# Patient Record
Sex: Female | Born: 1990 | Race: White | Hispanic: No | Marital: Married | Smoking: Never smoker
Health system: Southern US, Community
[De-identification: ages and names within clinical notes are randomized; demographics above are authoritative.]

## PROBLEM LIST (undated history)

## (undated) DIAGNOSIS — Z87442 Personal history of urinary calculi: Secondary | ICD-10-CM

## (undated) DIAGNOSIS — N200 Calculus of kidney: Secondary | ICD-10-CM

## (undated) DIAGNOSIS — F32A Depression, unspecified: Secondary | ICD-10-CM

## (undated) DIAGNOSIS — N83209 Unspecified ovarian cyst, unspecified side: Secondary | ICD-10-CM

## (undated) HISTORY — PX: APPENDECTOMY: SHX54

## (undated) HISTORY — PX: WISDOM TOOTH EXTRACTION: SHX21

---
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
## 2015-07-25 ENCOUNTER — Emergency Department (HOSPITAL_COMMUNITY)
Admission: EM | Admit: 2015-07-25 | Discharge: 2015-07-25 | Disposition: A | Discharge status: Home / Self Care | Payer: BLUE CROSS/BLUE SHIELD | Attending: Emergency Medicine | Admitting: Emergency Medicine

## 2015-07-25 ENCOUNTER — Emergency Department (HOSPITAL_COMMUNITY): Payer: BLUE CROSS/BLUE SHIELD

## 2015-07-25 ENCOUNTER — Encounter (HOSPITAL_COMMUNITY): Payer: Self-pay | Admitting: Emergency Medicine

## 2015-07-25 DIAGNOSIS — Z9049 Acquired absence of other specified parts of digestive tract: Secondary | ICD-10-CM | POA: Insufficient documentation

## 2015-07-25 DIAGNOSIS — Z79899 Other long term (current) drug therapy: Secondary | ICD-10-CM | POA: Diagnosis not present

## 2015-07-25 DIAGNOSIS — N83201 Unspecified ovarian cyst, right side: Secondary | ICD-10-CM | POA: Diagnosis not present

## 2015-07-25 DIAGNOSIS — R1031 Right lower quadrant pain: Secondary | ICD-10-CM | POA: Diagnosis present

## 2015-07-25 DIAGNOSIS — Z3202 Encounter for pregnancy test, result negative: Secondary | ICD-10-CM | POA: Insufficient documentation

## 2015-07-25 DIAGNOSIS — N83209 Unspecified ovarian cyst, unspecified side: Secondary | ICD-10-CM

## 2015-07-25 DIAGNOSIS — R52 Pain, unspecified: Secondary | ICD-10-CM

## 2015-07-25 LAB — CBC
HEMATOCRIT: 39.4 % (ref 36.0–46.0)
HEMOGLOBIN: 13.2 g/dL (ref 12.0–15.0)
MCH: 30.6 pg (ref 26.0–34.0)
MCHC: 33.5 g/dL (ref 30.0–36.0)
MCV: 91.4 fL (ref 78.0–100.0)
PLATELETS: 220 10*3/uL (ref 150–400)
RBC: 4.31 MIL/uL (ref 3.87–5.11)
RDW: 12.6 % (ref 11.5–15.5)
WBC: 11.4 10*3/uL — AB (ref 4.0–10.5)

## 2015-07-25 LAB — COMPREHENSIVE METABOLIC PANEL
ALT: 22 U/L (ref 14–54)
ANION GAP: 9 (ref 5–15)
AST: 23 U/L (ref 15–41)
Albumin: 5.1 g/dL — ABNORMAL HIGH (ref 3.5–5.0)
Alkaline Phosphatase: 63 U/L (ref 38–126)
BUN: 14 mg/dL (ref 6–20)
CHLORIDE: 103 mmol/L (ref 101–111)
CO2: 25 mmol/L (ref 22–32)
Calcium: 9 mg/dL (ref 8.9–10.3)
Creatinine, Ser: 0.62 mg/dL (ref 0.44–1.00)
Glucose, Bld: 106 mg/dL — ABNORMAL HIGH (ref 65–99)
POTASSIUM: 3.4 mmol/L — AB (ref 3.5–5.1)
SODIUM: 137 mmol/L (ref 135–145)
Total Bilirubin: 0.8 mg/dL (ref 0.3–1.2)
Total Protein: 8.4 g/dL — ABNORMAL HIGH (ref 6.5–8.1)

## 2015-07-25 LAB — URINALYSIS, ROUTINE W REFLEX MICROSCOPIC
Bilirubin Urine: NEGATIVE
GLUCOSE, UA: NEGATIVE mg/dL
HGB URINE DIPSTICK: NEGATIVE
Ketones, ur: NEGATIVE mg/dL
LEUKOCYTES UA: NEGATIVE
Nitrite: NEGATIVE
PROTEIN: NEGATIVE mg/dL
SPECIFIC GRAVITY, URINE: 1.027 (ref 1.005–1.030)
pH: 6.5 (ref 5.0–8.0)

## 2015-07-25 LAB — LIPASE, BLOOD: LIPASE: 25 U/L (ref 11–51)

## 2015-07-25 LAB — I-STAT BETA HCG BLOOD, ED (MC, WL, AP ONLY): I-stat hCG, quantitative: <5 m[IU]/mL (ref <5)

## 2015-07-25 MED ORDER — IOHEXOL 300 MG/ML  SOLN
50.0000 mL | Freq: Once | INTRAMUSCULAR | Status: AC | PRN
  Administered 2015-07-25: 50 mL via ORAL

## 2015-07-25 MED ORDER — IBUPROFEN 200 MG PO TABS
400.0000 mg | ORAL_TABLET | Freq: Once | ORAL | Status: AC
  Administered 2015-07-25: 400 mg via ORAL
  Filled 2015-07-25: qty 2

## 2015-07-25 MED ORDER — IOHEXOL 300 MG/ML  SOLN
100.0000 mL | Freq: Once | INTRAMUSCULAR | Status: AC | PRN
  Administered 2015-07-25: 100 mL via INTRAVENOUS

## 2015-07-25 NOTE — ED Notes (Signed)
Patient ambulatory to restroom without assistance, steady gait. 

## 2015-07-25 NOTE — ED Notes (Signed)
Per pt, states abdominal pain with BM-states rectal pain as well

## 2015-07-25 NOTE — ED Provider Notes (Signed)
CSN: [id number]     Arrival date & time 07/25/15  1051 History   First MD Initiated Contact with Patient 07/25/15 1213     Chief Complaint  Patient presents with  . Abdominal Pain     (Consider location/radiation/quality/duration/timing/severity/associated sxs/prior Treatment) HPI   Lorraine Barker is a 25 y.o. female who presents for evaluation of lower abdominal pain, bilaterally, onset 3 days ago and persistent. The pain has waxed and waned some during this period of time. She denies fever, chills, nausea, vomiting, cough, shortness of breath, dysuria, urinary frequency, vaginal discharge or she had a normal bowel movement yesterday. There is been no change in color of stool. The pain seems to be worse, when she attempts to defecate. No prior similar problem. No trauma. There are no other known modifying factors.   History reviewed. No pertinent past medical history. Past Surgical History  Procedure Laterality Date  . Appendectomy     No family history on file. Social History  Substance Use Topics  . Smoking status: Never Smoker   . Smokeless tobacco: None  . Alcohol Use: No   OB History    No data available     Review of Systems  All other systems reviewed and are negative.     Allergies  Review of patient's allergies indicates no known allergies.  Home Medications   Prior to Admission medications   Medication Sig Start Date End Date Taking? Authorizing Provider  BIOTIN PO Take 1 tablet by mouth daily.   Yes Historical Provider, MD  PROTEIN PO Take 1 Can by mouth daily.   Yes Historical Provider, MD  pseudoephedrine (SUDAFED) 120 MG 12 hr tablet Take 120 mg by mouth every 12 (twelve) hours as needed for congestion.   Yes Historical Provider, MD  traMADol (ULTRAM) 50 MG tablet Take 50-100 mg by mouth every 6 (six) hours as needed for moderate pain.   Yes Historical Provider, MD   BP 127/75 mmHg  Pulse 95  Temp(Src) 98.1 F (36.7 C) (Oral)  Resp 14  Ht   (1.6 m)  Wt 135 lb (61.236 kg)  BMI 23.92 kg/m2  SpO2 100%  LMP 07/06/2015 Physical Exam  Constitutional: She is oriented to person, place, and time. She appears well-developed and well-nourished.  HENT:  Head: Normocephalic and atraumatic.  Right Ear: External ear normal.  Left Ear: External ear normal.  Eyes: Conjunctivae and EOM are normal. Pupils are equal, round, and reactive to light.  Neck: Normal range of motion and phonation normal. Neck supple.  Cardiovascular: Normal rate, regular rhythm and normal heart sounds.   Pulmonary/Chest: Effort normal and breath sounds normal. She exhibits no bony tenderness.  Abdominal: Soft. She exhibits no mass. There is tenderness (Bilateral LQ tenderness, mild.). There is no rebound and no guarding.  Musculoskeletal: Normal range of motion.  Neurological: She is alert and oriented to person, place, and time. No cranial nerve deficit or sensory deficit. She exhibits normal muscle tone. Coordination normal.  Skin: Skin is warm, dry and intact.  Psychiatric: She has a normal mood and affect. Her behavior is normal. Judgment and thought content normal.  Nursing note and vitals reviewed.   ED Course  Procedures (including critical care time)  Medications - No data to display  Patient Vitals for the past 24 hrs:  BP Temp Temp src Pulse Resp SpO2 Height Weight  07/25/15 1112 127/75 mmHg 98.1 F (36.7 C) Oral 95 14 100 %  (1.6 m) 135  lb (61.236 kg)    5:24 PM Reevaluation with update and discussion. After initial assessment and treatment, an updated evaluation reveals she remains comfortable and has no additional complaints. Findings discussed with patient, and family members, all questions answered. Ugonna Keirsey L    Labs Review Labs Reviewed  COMPREHENSIVE METABOLIC PANEL - Abnormal; Notable for the following:    Potassium 3.4 (*)    Glucose, Bld 106 (*)    Total Protein 8.4 (*)    Albumin 5.1 (*)    All other components within  normal limits  CBC - Abnormal; Notable for the following:    WBC 11.4 (*)    All other components within normal limits  LIPASE, BLOOD  URINALYSIS, ROUTINE W REFLEX MICROSCOPIC (NOT AT [facility])    Imaging Review No results found. I have personally reviewed and evaluated these images and lab results as part of my medical decision-making.   EKG Interpretation None      MDM   Final diagnoses:  Hemorrhagic ovarian cyst    Ovarian cyst with mild bleeding, but no apparent infectious process, or hemodynamic instability.  Nursing Notes Reviewed/ Care Coordinated Applicable Imaging Reviewed Interpretation of Laboratory Data incorporated into ED treatment  The patient appears reasonably screened and/or stabilized for discharge and I doubt any other medical condition or other [facility] requiring further screening, evaluation, or treatment in the ED at this time prior to discharge.  Plan: Home Medications- OTC analgesia; Home Treatments- rest; return here if the recommended treatment, does not improve the symptoms; Recommended follow up- GYN 3-4 weeks and prn     Mancel Bale, MD 07/25/15 1726

## 2015-07-25 NOTE — Discharge Instructions (Signed)
Ovarian Cyst An ovarian cyst is a fluid-filled sac that forms on an ovary. The ovaries are small organs that produce eggs in women. Various types of cysts can form on the ovaries. Most are not cancerous. Many do not cause problems, and they often go away on their own. Some may cause symptoms and require treatment. Common types of ovarian cysts include:  Functional cysts--These cysts may occur every month during the menstrual cycle. This is normal. The cysts usually go away with the next menstrual cycle if the woman does not get pregnant. Usually, there are no symptoms with a functional cyst.  Endometrioma cysts--These cysts form from the tissue that lines the uterus. They are also called "chocolate cysts" because they become filled with blood that turns brown. This type of cyst can cause pain in the lower abdomen during intercourse and with your menstrual period.  Cystadenoma cysts--This type develops from the cells on the outside of the ovary. These cysts can get very big and cause lower abdomen pain and pain with intercourse. This type of cyst can twist on itself, cut off its blood supply, and cause severe pain. It can also easily rupture and cause a lot of pain.  Dermoid cysts--This type of cyst is sometimes found in both ovaries. These cysts may contain different kinds of body tissue, such as skin, teeth, hair, or cartilage. They usually do not cause symptoms unless they get very big.  Theca lutein cysts--These cysts occur when too much of a certain hormone (human chorionic gonadotropin) is produced and overstimulates the ovaries to produce an egg. This is most common after procedures used to assist with the conception of a baby (in vitro fertilization). CAUSES   Fertility drugs can cause a condition in which multiple large cysts are formed on the ovaries. This is called ovarian hyperstimulation syndrome.  A condition called polycystic ovary syndrome can cause hormonal imbalances that can lead to  nonfunctional ovarian cysts. SIGNS AND SYMPTOMS  Many ovarian cysts do not cause symptoms. If symptoms are present, they may include:  Pelvic pain or pressure.  Pain in the lower abdomen.  Pain during sexual intercourse.  Increasing girth (swelling) of the abdomen.  Abnormal menstrual periods.  Increasing pain with menstrual periods.  Stopping having menstrual periods without being pregnant. DIAGNOSIS  These cysts are commonly found during a routine or annual pelvic exam. Tests may be ordered to find out more about the cyst. These tests may include:  Ultrasound.  X-ray of the pelvis.  CT scan.  MRI.  Blood tests. TREATMENT  Many ovarian cysts go away on their own without treatment. Your health care provider may want to check your cyst regularly for 2-3 months to see if it changes. For women in menopause, it is particularly important to monitor a cyst closely because of the higher rate of ovarian cancer in menopausal women. When treatment is needed, it may include any of the following:  A procedure to drain the cyst (aspiration). This may be done using a long needle and ultrasound. It can also be done through a laparoscopic procedure. This involves using a thin, lighted tube with a tiny camera on the end (laparoscope) inserted through a small incision.  Surgery to remove the whole cyst. This may be done using laparoscopic surgery or an open surgery involving a larger incision in the lower abdomen.  Hormone treatment or birth control pills. These methods are sometimes used to help dissolve a cyst. HOME CARE INSTRUCTIONS   Only take over-the-counter   or prescription medicines as directed by your health care provider.  Follow up with your health care provider as directed.  Get regular pelvic exams and Pap tests. SEEK MEDICAL CARE IF:   Your periods are late, irregular, or painful, or they stop.  Your pelvic pain or abdominal pain does not go away.  Your abdomen becomes  larger or swollen.  You have pressure on your bladder or trouble emptying your bladder completely.  You have pain during sexual intercourse.  You have feelings of fullness, pressure, or discomfort in your stomach.  You lose weight for no apparent reason.  You feel generally ill.  You become constipated.  You lose your appetite.  You develop acne.  You have an increase in body and facial hair.  You are gaining weight, without changing your exercise and eating habits.  You think you are pregnant. SEEK IMMEDIATE MEDICAL CARE IF:   You have increasing abdominal pain.  You feel sick to your stomach (nauseous), and you throw up (vomit).  You develop a fever that comes on suddenly.  You have abdominal pain during a bowel movement.  Your menstrual periods become heavier than usual. MAKE SURE YOU:  Understand these instructions.  Will watch your condition.  Will get help right away if you are not doing well or get worse.   This information is not intended to replace advice given to you by your health care provider. Make sure you discuss any questions you have with your health care provider.   Document Released: 06/15/2005 Document Revised: 06/20/2013 Document Reviewed: 02/20/2013 Elsevier Interactive Patient Education 2016 [employer]  

## 2017-04-19 ENCOUNTER — Emergency Department (HOSPITAL_COMMUNITY): Payer: Commercial Managed Care - PPO

## 2017-04-19 ENCOUNTER — Emergency Department (HOSPITAL_COMMUNITY)
Admission: EM | Admit: 2017-04-19 | Discharge: 2017-04-19 | Disposition: A | Discharge status: Home / Self Care | Payer: Commercial Managed Care - PPO | Attending: Emergency Medicine | Admitting: Emergency Medicine

## 2017-04-19 ENCOUNTER — Encounter (HOSPITAL_COMMUNITY): Payer: Self-pay

## 2017-04-19 DIAGNOSIS — N130 Hydronephrosis with ureteropelvic junction obstruction: Secondary | ICD-10-CM | POA: Diagnosis not present

## 2017-04-19 DIAGNOSIS — R1110 Vomiting, unspecified: Secondary | ICD-10-CM | POA: Diagnosis not present

## 2017-04-19 DIAGNOSIS — N201 Calculus of ureter: Secondary | ICD-10-CM

## 2017-04-19 DIAGNOSIS — R1032 Left lower quadrant pain: Secondary | ICD-10-CM | POA: Diagnosis present

## 2017-04-19 DIAGNOSIS — Z79899 Other long term (current) drug therapy: Secondary | ICD-10-CM | POA: Insufficient documentation

## 2017-04-19 DIAGNOSIS — N23 Unspecified renal colic: Secondary | ICD-10-CM

## 2017-04-19 DIAGNOSIS — R109 Unspecified abdominal pain: Secondary | ICD-10-CM

## 2017-04-19 HISTORY — DX: Calculus of kidney: N20.0

## 2017-04-19 HISTORY — DX: Unspecified ovarian cyst, unspecified side: N83.209

## 2017-04-19 LAB — URINALYSIS, ROUTINE W REFLEX MICROSCOPIC
BILIRUBIN URINE: NEGATIVE
Glucose, UA: NEGATIVE mg/dL
KETONES UR: NEGATIVE mg/dL
Leukocytes, UA: NEGATIVE
NITRITE: NEGATIVE
PROTEIN: NEGATIVE mg/dL
Specific Gravity, Urine: 1.009 (ref 1.005–1.030)
pH: 5 (ref 5.0–8.0)

## 2017-04-19 LAB — PREGNANCY, URINE: PREG TEST UR: NEGATIVE

## 2017-04-19 MED ORDER — KETOROLAC TROMETHAMINE 60 MG/2ML IM SOLN
60.0000 mg | Freq: Once | INTRAMUSCULAR | Status: AC
  Administered 2017-04-19: 60 mg via INTRAMUSCULAR
  Filled 2017-04-19: qty 2

## 2017-04-19 MED ORDER — HYDROCODONE-ACETAMINOPHEN 5-325 MG PO TABS: ORAL_TABLET | ORAL | 0 refills | Status: DC

## 2017-04-19 MED ORDER — MORPHINE SULFATE (PF) 4 MG/ML IV SOLN
4.0000 mg | Freq: Once | INTRAVENOUS | Status: AC
  Administered 2017-04-19: 4 mg via INTRAMUSCULAR
  Filled 2017-04-19: qty 1

## 2017-04-19 MED ORDER — ONDANSETRON 8 MG PO TBDP
8.0000 mg | ORAL_TABLET | Freq: Once | ORAL | Status: AC
  Administered 2017-04-19: 8 mg via ORAL
  Filled 2017-04-19: qty 1

## 2017-04-19 MED ORDER — NAPROXEN 250 MG PO TABS: 250.0000 mg | ORAL_TABLET | Freq: Two times a day (BID) | ORAL | 0 refills | Status: DC

## 2017-04-19 MED ORDER — ONDANSETRON 4 MG PO TBDP: 4.0000 mg | ORAL_TABLET | Freq: Three times a day (TID) | ORAL | 0 refills | Status: DC | PRN

## 2017-04-19 MED ORDER — NAPROXEN 250 MG PO TABS: 250.0000 mg | ORAL_TABLET | Freq: Two times a day (BID) | ORAL | 0 refills | Status: DC | PRN

## 2017-04-19 NOTE — ED Provider Notes (Signed)
[facility]-EMERGENCY DEPT Provider Note   CSN: [id number] Arrival date & time: 04/19/17  [id number]     History   Chief Complaint Chief Complaint  Patient presents with  . Flank Pain  . Emesis    HPI Lorraine Barker is a 26 y.o. female.  HPI  Pt was seen at 0925. Per pt, c/o sudden onset and persistence of waxing and waning left sided flank "pain" that began this morning at 0500 PTA.  Pt describes the pain as "like my last kidney stone," and radiating into the left side of her abd.  Has been associated with multiple intermittent episodes of N/V.  Denies vaginal bleeding/discharge, no dysuria/hematuria, no abd pain, no diarrhea, no black or blood in emesis, no CP/SOB, no fevers, no rash.      Past Medical History:  Diagnosis Date  . Kidney stone   . Ovarian cyst     There are no active problems to display for this patient.   Past Surgical History:  Procedure Laterality Date  . APPENDECTOMY      OB History    No data available       Home Medications    Prior to Admission medications   Medication Sig Start Date End Date Taking? Authorizing Provider  BIOTIN PO Take 1 tablet by mouth daily.    [provider]  PROTEIN PO Take 1 Can by mouth daily.    [provider]  pseudoephedrine (SUDAFED) 120 MG 12 hr tablet Take 120 mg by mouth every 12 (twelve) hours as needed for congestion.    [provider]  traMADol (ULTRAM) 50 MG tablet Take 50-100 mg by mouth every 6 (six) hours as needed for moderate pain.    [provider]    Family History Family History  Problem Relation Age of Onset  . Kidney Stones Father     Social History Social History  Substance Use Topics  . Smoking status: Never Smoker  . Smokeless tobacco: Never Used  . Alcohol use No     Allergies   Patient has no known allergies.   Review of Systems Review of Systems ROS: Statement: All systems negative except as marked or noted in the  HPI; Constitutional: Negative for fever and chills. ; ; Eyes: Negative for eye pain, redness and discharge. ; ; ENMT: Negative for ear pain, hoarseness, nasal congestion, sinus pressure and sore throat. ; ; Cardiovascular: Negative for chest pain, palpitations, diaphoresis, dyspnea and peripheral edema. ; ; Respiratory: Negative for cough, wheezing and stridor. ; ; Gastrointestinal: +N/V. Negative for diarrhea, abdominal pain, blood in stool, hematemesis, jaundice and rectal bleeding. . ; ; Genitourinary: Negative for dysuria, and hematuria. ; ; GYN:  No pelvic pain, no vaginal bleeding, no vaginal discharge, no vulvar pain. ;; Musculoskeletal: +LBP. Negative for neck pain. Negative for swelling and trauma.; ; Skin: Negative for pruritus, rash, abrasions, blisters, bruising and skin lesion.; ; Neuro: Negative for headache, lightheadedness and neck stiffness. Negative for weakness, altered level of consciousness, altered mental status, extremity weakness, paresthesias, involuntary movement, seizure and syncope.       Physical Exam Updated Vital Signs BP 122/85 (BP Location: Left Arm)   Pulse 69   Temp 98.2 F (36.8 C) (Oral)   Resp 16   Ht 5' 3.5" (1.613 m)   Wt 81.6 kg (180 lb)   LMP 03/28/2017 (Approximate)   SpO2 100%   BMI 31.39 kg/m   Physical Exam 0930: Physical examination:  Nursing notes  reviewed; Vital signs and O2 SAT reviewed;  Constitutional: Well developed, Well nourished, Well hydrated, Uncomfortable appearing.; Head:  Normocephalic, atraumatic; Eyes: EOMI, PERRL, No scleral icterus; ENMT: Mouth and pharynx normal, Mucous membranes moist; Neck: Supple, Full range of motion, No lymphadenopathy; Cardiovascular: Regular rate and rhythm, No gallop; Respiratory: Breath sounds clear & equal bilaterally, No wheezes.  Speaking full sentences with ease, Normal respiratory effort/excursion; Chest: Nontender, Movement normal; Abdomen: Soft, Nontender, Nondistended, Normal bowel sounds;  Genitourinary: No CVA tenderness; Spine:  No midline CS, TS, LS tenderness.;; Extremities: Pulses normal, No tenderness, No edema, No calf edema or asymmetry.; Neuro: AA&Ox3, Major CN grossly intact.  Speech clear. No gross focal motor or sensory deficits in extremities.; Skin: Color normal, Warm, Dry.   ED Treatments / Results  Labs (all labs ordered are listed, but only abnormal results are displayed)   EKG  EKG Interpretation None       Radiology   Procedures Procedures (including critical care time)  Medications Ordered in ED Medications  ondansetron (ZOFRAN-ODT) disintegrating tablet 8 mg (not administered)  morphine 4 MG/ML injection 4 mg (4 mg Intramuscular Given 04/19/17 0934)     Initial Impression / Assessment and Plan / ED Course  I have reviewed the triage vital signs and the nursing notes.  Pertinent labs & imaging results that were available during my care of the patient were reviewed by me and considered in my medical decision making (see chart for details).  MDM Reviewed: previous chart, nursing note and vitals Reviewed previous: CT scan and ultrasound Interpretation: labs and CT scan    Results for orders placed or performed during the hospital encounter of 04/19/17  Urinalysis, Routine w reflex microscopic- may I&O cath if menses  Result Value Ref Range   Color, Urine YELLOW YELLOW   APPearance CLEAR CLEAR   Specific Gravity, Urine 1.009 1.005 - 1.030   pH 5.0 5.0 - 8.0   Glucose, UA NEGATIVE NEGATIVE mg/dL   Hgb urine dipstick LARGE (A) NEGATIVE   Bilirubin Urine NEGATIVE NEGATIVE   Ketones, ur NEGATIVE NEGATIVE mg/dL   Protein, ur NEGATIVE NEGATIVE mg/dL   Nitrite NEGATIVE NEGATIVE   Leukocytes, UA NEGATIVE NEGATIVE   RBC / HPF 6-30 0 - 5 RBC/hpf   WBC, UA 0-5 0 - 5 WBC/hpf   Bacteria, UA RARE (A) NONE SEEN   Squamous Epithelial / LPF 0-5 (A) NONE SEEN   Mucus PRESENT   Pregnancy, urine  Result Value Ref Range   Preg Test, Ur NEGATIVE  NEGATIVE   Ct Renal Stone Study Result Date: 04/19/2017 CLINICAL DATA:  Left flank pain radiating to the left lower quadrant beginning 6 hours ago. History of stone disease. EXAM: CT ABDOMEN AND PELVIS WITHOUT CONTRAST TECHNIQUE: Multidetector CT imaging of the abdomen and pelvis was performed following the standard protocol without IV contrast. COMPARISON:  07/25/2015 FINDINGS: Lower chest: Normal Hepatobiliary: Normal Pancreas: Normal Spleen: Normal Adrenals/Urinary Tract: Adrenal glands are normal. Right kidney is normal. Left kidney shows mild swelling an mild prominence of the collecting system an ureter. There is a 2 mm stone at the left UVJ. No other stone. Stomach/Bowel: Normal Vascular/Lymphatic: Normal Reproductive: Normal Other: No free fluid or air. Musculoskeletal: Normal IMPRESSION: 2 mm stone at the left UVJ, about pass into the bladder. Mild/ minimal hydronephrosis on the left. No other urinary tract finding. Electronically Signed   By: Paulina Fusi M.D.   On: 04/19/2017 12:43    1335:  Pt has tol PO well while  in the ED without N/V.  Abd remains benign, VSS. Feels better and wants to go home now. Tx symptomatically at this time. Dx and testing d/w pt and family.  Questions answered.  Verb understanding, agreeable to d/c home with outpt f/u.     Final Clinical Impressions(s) / ED Diagnoses   Final diagnoses:  None    New Prescriptions New Prescriptions   No medications on file     Samuel Jester, DO 04/23/17 1610

## 2017-04-19 NOTE — ED Triage Notes (Signed)
Patient c/o left flank pain at 0500 today. Patient also c/o N/V and and dysuria. Patient states she took Phenergan and a Hydrocone that her per father had for pain at 0730.

## 2017-04-19 NOTE — Discharge Instructions (Signed)
Take the prescriptions as directed.  Call your regular medical doctor and the Urologist today to schedule a follow up appointment this week.  Return to the Emergency Department immediately if worsening.

## 2018-04-12 ENCOUNTER — Encounter: Payer: Self-pay | Admitting: Physician Assistant

## 2018-04-12 ENCOUNTER — Ambulatory Visit (INDEPENDENT_AMBULATORY_CARE_PROVIDER_SITE_OTHER): Payer: BLUE CROSS/BLUE SHIELD | Admitting: Physician Assistant

## 2018-04-12 VITALS — BP 118/74 | HR 82 | Temp 98.7°F | Ht 63.5 in | Wt 172.4 lb

## 2018-04-12 DIAGNOSIS — K580 Irritable bowel syndrome with diarrhea: Secondary | ICD-10-CM | POA: Insufficient documentation

## 2018-04-12 DIAGNOSIS — Z01419 Encounter for gynecological examination (general) (routine) without abnormal findings: Secondary | ICD-10-CM | POA: Diagnosis not present

## 2018-04-12 MED ORDER — HYOSCYAMINE SULFATE 0.125 MG PO TABS: 0.1250 mg | ORAL_TABLET | Freq: Four times a day (QID) | ORAL | 5 refills | Status: DC | PRN

## 2018-04-12 NOTE — Progress Notes (Signed)
BP 118/74   Pulse 82   Temp 98.7 F (37.1 C) (Oral)   Ht 5' 3.5" (1.613 m)   Wt 172 lb 6.4 oz (78.2 kg)   LMP 03/28/2018   BMI 30.06 kg/m    Subjective:    Patient ID: Lorraine Barker, female    DOB: [birth year], 27 y.o.   MRN: [id number]  HPI: Shirleymae Hauth is a 27 y.o. female presenting on 04/12/2018 for Annual Exam  This patient comes in for annual well physical examination. All medications are reviewed today. There are no reports of any problems with the medications. All of the medical conditions are reviewed and updated.  Lab work is reviewed and will be ordered as medically necessary.   The only problem that the patient reports is that she is having episodes of irritable bowel.  She will notice it some.  Having more in restaurants and will happen when she travels.  She is uncertain if it is related somewhat to be nervous.  We have had a long discussion about the possibility of this in that he can be aggravated by emotions.  She has never taken anything except Pepto-Bismol orally.  She states that it did not help any.  Past Medical History:  Diagnosis Date  . Kidney stone   . Ovarian cyst    Relevant past medical, surgical, family and social history reviewed and updated as indicated. Interim medical history since our last visit reviewed. Allergies and medications reviewed and updated. DATA REVIEWED: CHART IN EPIC  Family History reviewed for pertinent findings.  Review of Systems  Constitutional: Negative.  Negative for activity change, fatigue and fever.  HENT: Negative.   Eyes: Negative.   Respiratory: Negative.  Negative for cough.   Cardiovascular: Negative.  Negative for chest pain.  Gastrointestinal: Positive for abdominal distention and diarrhea. Negative for abdominal pain.  Endocrine: Negative.   Genitourinary: Negative.  Negative for dysuria.  Musculoskeletal: Negative.   Skin: Negative.   Neurological: Negative.     Allergies as of 04/12/2018   No Known  Allergies     Medication List        Accurate as of 04/12/18  1:40 PM. Always use your most recent med list.          hyoscyamine 0.125 MG tablet Commonly known as:  LEVSIN, ANASPAZ Take 1 tablet (0.125 mg total) by mouth every 6 (six) hours as needed for cramping (IBS).   loratadine 10 MG tablet Commonly known as:  CLARITIN Take 10 mg by mouth daily as needed for allergies.          Objective:    BP 118/74   Pulse 82   Temp 98.7 F (37.1 C) (Oral)   Ht 5' 3.5" (1.613 m)   Wt 172 lb 6.4 oz (78.2 kg)   LMP 03/28/2018   BMI 30.06 kg/m   No Known Allergies  Wt Readings from Last 3 Encounters:  04/12/18 172 lb 6.4 oz (78.2 kg)  04/19/17 180 lb (81.6 kg)  07/25/15 135 lb (61.2 kg)    Physical Exam  Constitutional: She is oriented to person, place, and time. She appears well-developed and well-nourished.  HENT:  Head: Normocephalic and atraumatic.  Eyes: Pupils are equal, round, and reactive to light. Conjunctivae and EOM are normal.  Neck: Normal range of motion. Neck supple.  Cardiovascular: Normal rate, regular rhythm, normal heart sounds and intact distal pulses.  Pulmonary/Chest: Effort normal and breath sounds normal. Right breast exhibits no mass, no skin  change and no tenderness. Left breast exhibits no mass, no skin change and no tenderness. No breast tenderness, discharge or bleeding. Breasts are symmetrical.  Abdominal: Soft. Bowel sounds are normal.  Genitourinary: Vagina normal and uterus normal. Rectal exam shows no fissure. No breast tenderness, discharge or bleeding. There is no tenderness or lesion on the right labia. There is no tenderness or lesion on the left labia. Uterus is not deviated, not enlarged and not tender. Cervix exhibits no motion tenderness, no discharge and no friability. Right adnexum displays no mass, no tenderness and no fullness. Left adnexum displays no mass, no tenderness and no fullness. No tenderness or bleeding in the vagina. No  vaginal discharge found.  Neurological: She is alert and oriented to person, place, and time. She has normal reflexes.  Skin: Skin is warm and dry. No rash noted.  Psychiatric: She has a normal mood and affect. Her behavior is normal. Judgment and thought content normal.        Assessment & Plan:   1. Well female exam with routine gynecological exam - Pap IG, CT/NG w/ reflex HPV when ASC-U  2. Irritable bowel syndrome with diarrhea - hyoscyamine (LEVSIN, ANASPAZ) 0.125 MG tablet; Take 1 tablet (0.125 mg total) by mouth every 6 (six) hours as needed for cramping (IBS).  Dispense: 30 tablet; Refill: 5 - Ambulatory referral to Gastroenterology   Continue all other maintenance medications as listed above.  Follow up plan: No follow-ups on file.  Educational handout given for IBS information  Remus Loffler PA-C [facility] [address]  [place], [place] [address] [phone]   04/12/2018, 1:40 PM

## 2018-04-12 NOTE — Patient Instructions (Signed)
Irritable Bowel Syndrome, Adult Irritable bowel syndrome (IBS) is not one specific disease. It is a group of symptoms that affects the organs responsible for digestion (gastrointestinal or GI tract). To regulate how your GI tract works, your body sends signals back and forth between your intestines and your brain. If you have IBS, there may be a problem with these signals. As a result, your GI tract does not function normally. Your intestines may become more sensitive and overreact to certain things. This is especially true when you eat certain foods or when you are under stress. There are four types of IBS. These may be determined based on the consistency of your stool:  IBS with diarrhea.  IBS with constipation.  Mixed IBS.  Unsubtyped IBS.  It is important to know which type of IBS you have. Some treatments are more likely to be helpful for certain types of IBS. What are the causes? The exact cause of IBS is not known. What increases the risk? You may have a higher risk of IBS if:  You are a woman.  You are younger than 29 years old.  You have a family history of IBS.  You have mental health problems.  You have had bacterial infection of your GI tract.  What are the signs or symptoms? Symptoms of IBS vary from person to person. The main symptom is abdominal pain or discomfort. Additional symptoms usually include one or more of the following:  Diarrhea, constipation, or both.  Abdominal swelling or bloating.  Feeling full or sick after eating a small or regular-size meal.  Frequent gas.  Mucus in the stool.  A feeling of having more stool left after a bowel movement.  Symptoms tend to come and go. They may be associated with stress, psychiatric conditions, or nothing at all. How is this diagnosed? There is no specific test to diagnose IBS. Your health care provider will make a diagnosis based on a physical exam, medical history, and your symptoms. You may have other  tests to rule out other conditions that may be causing your symptoms. These may include:  Blood tests.  X-rays.  CT scan.  Endoscopy and colonoscopy. This is a test in which your GI tract is viewed with a long, thin, flexible tube.  How is this treated? There is no cure for IBS, but treatment can help relieve symptoms. IBS treatment often includes:  Changes to your diet, such as: ? Eating more fiber. ? Avoiding foods that cause symptoms. ? Drinking more water. ? Eating regular, medium-sized portioned meals.  Medicines. These may include: ? Fiber supplements if you have constipation. ? Medicine to control diarrhea (antidiarrheal medicines). ? Medicine to help control muscle spasms in your GI tract (antispasmodic medicines). ? Medicines to help with any mental health issues, such as antidepressants or tranquilizers.  Therapy. ? Talk therapy may help with anxiety, depression, or other mental health issues that can make IBS symptoms worse.  Stress reduction. ? Managing your stress can help keep symptoms under control.  Follow these instructions at home:  Take medicines only as directed by your health care provider.  Eat a healthy diet. ? Avoid foods and drinks with added sugar. ? Include more whole grains, fruits, and vegetables gradually into your diet. This may be especially helpful if you have IBS with constipation. ? Avoid any foods and drinks that make your symptoms worse. These may include dairy products and caffeinated or carbonated drinks. ? Do not eat large meals. ? Drink enough   fluid to keep your urine clear or pale yellow.  Exercise regularly. Ask your health care provider for recommendations of good activities for you.  Keep all follow-up visits as directed by your health care provider. This is important. Contact a health care provider if:  You have constant pain.  You have trouble or pain with swallowing.  You have worsening diarrhea. Get help right away  if:  You have severe and worsening abdominal pain.  You have diarrhea and: ? You have a rash, stiff neck, or severe headache. ? You are irritable, sleepy, or difficult to awaken. ? You are weak, dizzy, or extremely thirsty.  You have bright red blood in your stool or you have black tarry stools.  You have unusual abdominal swelling that is painful.  You vomit continuously.  You vomit blood (hematemesis).  You have both abdominal pain and a fever. This information is not intended to replace advice given to you by your health care provider. Make sure you discuss any questions you have with your health care provider. Document Released: 06/15/2005 Document Revised: 11/15/2015 Document Reviewed: 03/02/2014 Elsevier Interactive Patient Education  2018 [employer]  

## 2018-04-16 LAB — PAP IG, CT-NG, RFX HPV ASCU
Chlamydia, Nuc. Acid Amp: NEGATIVE
Gonococcus by Nucleic Acid Amp: NEGATIVE
PAP SMEAR COMMENT: 0

## 2018-05-16 ENCOUNTER — Ambulatory Visit: Payer: BLUE CROSS/BLUE SHIELD

## 2018-05-17 ENCOUNTER — Telehealth: Payer: Commercial Managed Care - PPO | Admitting: Physician Assistant

## 2018-05-17 DIAGNOSIS — J069 Acute upper respiratory infection, unspecified: Secondary | ICD-10-CM

## 2018-05-17 MED ORDER — IBUPROFEN 600 MG PO TABS: 600.0000 mg | ORAL_TABLET | Freq: Three times a day (TID) | ORAL | 0 refills | Status: DC | PRN

## 2018-05-17 MED ORDER — BENZONATATE 100 MG PO CAPS: 100.0000 mg | ORAL_CAPSULE | Freq: Three times a day (TID) | ORAL | 0 refills | Status: DC

## 2018-05-17 MED ORDER — IPRATROPIUM BROMIDE 0.06 % NA SOLN: 2.0000 | Freq: Four times a day (QID) | NASAL | 12 refills | Status: DC

## 2018-05-17 NOTE — Progress Notes (Signed)
We are sorry you are not feeling well.  Here is how we plan to help!  Based on what you have shared with me, it looks like you may have a viral upper respiratory infection or a "common cold".  Colds are caused by a large number of viruses; however, rhinovirus is the most common cause.   Symptoms of the common cold vary from person to person, with common symptoms including sore throat, cough, and malaise.  A low-grade fever of 100.4 may present, but is often uncommon.  Symptoms vary however, and are closely related to a person's age or underlying illnesses.  The most common symptoms associated with the common cold are nasal discharge or congestion, cough, sneezing, headache and pressure in the ears and face.  Cold symptoms usually persist for about 3 to 10 days, but can last up to 2 weeks.  It is important to know that colds do not cause serious illness or complications in most cases.    The common cold is transmitted from person to person, with the most common method of transmission being a person's hands.  The virus is able to live on the skin and can infect other persons for up to 2 hours after direct contact.  Also, colds are transmitted when someone coughs or sneezes; thus, it is important to cover the mouth to reduce this risk.  To keep the spread of the common cold at bay, good hand hygiene is very important.  This is an infection that is most likely caused by a virus. There are no specific treatments for the common cold other than to help you with the symptoms until the infection runs its course.    For nasal congestion, you may use an oral decongestants such as Mucinex D or if you have glaucoma or high blood pressure use plain Mucinex.  Saline nasal spray or nasal drops can help and can safely be used as often as needed for congestion.  For your congestion, I have prescribed Ipratropium Bromide nasal spray 0.03% two sprays in each nostril 2-3 times a day  If you do not have a history of heart  disease, hypertension, diabetes or thyroid disease, prostate/bladder issues or glaucoma, you may also use Sudafed to treat nasal congestion.  It is highly recommended that you consult with a pharmacist or your primary care physician to ensure this medication is safe for you to take.     If you have a cough, you may use cough suppressants such as Delsym and Robitussin.  If you have glaucoma or high blood pressure, you can also use Coricidin HBP.   For cough I have prescribed for you A prescription cough medication called Tessalon Perles 100 mg. You may take 1-2 capsules every 8 hours as needed for cough. I am also prescribing a prescription strength NSAID to help with fever, aches, and fatigue.   If you have a sore or scratchy throat, use a saltwater gargle-  to  teaspoon of salt dissolved in a 4-ounce to 8-ounce glass of warm water.  Gargle the solution for approximately 15-30 seconds and then spit.  It is important not to swallow the solution.  You can also use throat lozenges/cough drops and Chloraseptic spray to help with throat pain or discomfort.  Warm or cold liquids can also be helpful in relieving throat pain.  For headache, pain or general discomfort, you can use Ibuprofen or Tylenol as directed.   Some authorities believe that zinc sprays or the use of Echinacea  may shorten the course of your symptoms.   HOME CARE Only take medications as instructed by your medical team. Be sure to drink plenty of fluids. Water is fine as well as fruit juices, sodas and electrolyte beverages. You may want to stay away from caffeine or alcohol. If you are nauseated, try taking small sips of liquids. How do you know if you are getting enough fluid? Your urine should be a pale yellow or almost colorless. Get rest. Taking a steamy shower or using a humidifier may help nasal congestion and ease sore throat pain. You can place a towel over your head and breathe in the steam from hot water coming from a  faucet. Using a saline nasal spray works much the same way. Cough drops, hard candies and sore throat lozenges may ease your cough. Avoid close contacts especially the very young and the elderly Cover your mouth if you cough or sneeze Always remember to wash your hands.   GET HELP RIGHT AWAY IF: You develop worsening fever. If your symptoms do not improve within 10 days You become short of breath. You develop yellow or green discharge from your nose over 3 days. You have coughing fits You develop a severe head ache or visual changes. You develop shortness of breath or difficulty breathing. Your symptoms persist after you have completed your treatment plan  MAKE SURE YOU  Understand these instructions. Will watch your condition. Will get help right away if you are not doing well or get worse.  Your e-visit answers were reviewed by a board certified advanced clinical practitioner to complete your personal care plan. Depending upon the condition, your plan could have included both over the counter or prescription medications. Please review your pharmacy choice. If there is a problem, you may call our nursing hot line at and have the prescription routed to another pharmacy. Your safety is important to [place]. If you have drug allergies check your prescription carefully.   You can use MyChart to ask questions about today's visit, request a non-urgent call back, or ask for a work or school excuse for 24 hours related to this e-Visit. If it has been greater than 24 hours you will need to follow up with your provider, or enter a new e-Visit to address those concerns. You will get an e-mail in the next two days asking about your experience.  I hope that your e-visit has been valuable and will speed your recovery. Thank you for using e-visits.      ===View-only below this line===   ----- Message -----    From: Lorie Phenix    Sent: 05/17/2018  9:09 AM EST      To: E-Visit Mailing  List Subject: E-Visit Submission: Cough  E-Visit Submission: Cough --------------------------------  Question: How long have you been coughing? Answer:   3 days  Question: How would you describe the cough? Answer:   A cough that is part of a cold  Question: How often are you coughing? Answer:   In spasms that come and go  Question: Does the cough prevent you from sleeping at night? Answer:   Yes  Question: What other symptoms have you experienced with the cough? Answer:   Runny nose            Blocked sinuses            Sore throat            Swollen glands  Question: Do you have a fever? Answer:  I do not know  Question: Describe your sore throat: Answer:   Sore throat started Friday night felt like it was coming from sinus drainage issues with my congestion. But, it has persisted since then. Sunday it was really bad hurt to swallow but felt like I needed to keep drinking things to keep it soothed. Sunday I also ran a fever of 100.7 at around 4:00pm, I did not have a fever yesterday. This morning (I just took it) it's at 99.1, I usually run a temperature of 98.6. So, it is only slightly above that today.  Question: How long have you had a sore throat? Answer:   3 days  Question: Do you have any tenderness or swelling in your neck? Answer:   No  Question: Are you coughing up any mucus? Answer:   I am coughing up a little bit of mucus  Question: Do you use a maintenance inhaler? Answer:   No  Question: Do you use a rescue inhaler (such as Ventolin?) Answer:   No  Question: Have you previously required a prescription for prednisone for cough? Answer:   No  Question: Are you diabetic? Answer:   No  Question: Are you pregnant? Answer:   I am confident that I am not pregnant  Question: Are you breastfeeding? Answer:   No  Question: What is the appearance of the mucus? Answer:   I am swallowing everything I cough up  Question: Do you have any of the  following? Answer:   None of the above  Question: Do you smoke? Answer:   No  Question: Have you ever smoked? Answer:   I smoked in the past, but not regularly  Question: Are there people you know with similar symptoms? Answer:   No  Question: Are you experiencing any of the following? Answer:   None of  the above  Question: Are you having difficulty breathing? Answer:   No  Question: Is your coughing worse when you are exposed to pollen, dust, or other things in the environment? Answer:   I dont know  Question: Have you been treated for a similar cough in the past? Answer:   No  Question: Have you ever been diagnosed with asthma, bronchitis, or lung disease? Answer:   No  Question: Have you recently started on any medications for your heart or for blood pressure? Answer:   No  Question: Have you recently been hospitalized? Answer:   No  Question: Please list your medication allergies that you may have ? (If 'none' , please list as 'none') Answer:   I am not sure if the name but it is similar to Claritin  Question: Please list any additional comments  Answer:

## 2018-05-24 ENCOUNTER — Encounter: Payer: Self-pay | Admitting: Physician Assistant

## 2018-06-03 ENCOUNTER — Ambulatory Visit: Payer: BLUE CROSS/BLUE SHIELD | Admitting: Physician Assistant

## 2020-01-31 ENCOUNTER — Encounter (HOSPITAL_COMMUNITY): Payer: Self-pay | Admitting: Emergency Medicine

## 2020-01-31 ENCOUNTER — Emergency Department (HOSPITAL_COMMUNITY)
Admission: EM | Admit: 2020-01-31 | Discharge: 2020-01-31 | Disposition: A | Discharge status: Home / Self Care | Payer: BC Managed Care – PPO | Attending: Emergency Medicine | Admitting: Emergency Medicine

## 2020-01-31 ENCOUNTER — Other Ambulatory Visit: Payer: Self-pay

## 2020-01-31 DIAGNOSIS — R1030 Lower abdominal pain, unspecified: Secondary | ICD-10-CM | POA: Insufficient documentation

## 2020-01-31 DIAGNOSIS — Z5321 Procedure and treatment not carried out due to patient leaving prior to being seen by health care provider: Secondary | ICD-10-CM | POA: Diagnosis not present

## 2020-01-31 LAB — CBC
HCT: 37.7 % (ref 36.0–46.0)
Hemoglobin: 12.1 g/dL (ref 12.0–15.0)
MCH: 28.7 pg (ref 26.0–34.0)
MCHC: 32.1 g/dL (ref 30.0–36.0)
MCV: 89.3 fL (ref 80.0–100.0)
Platelets: 233 10*3/uL (ref 150–400)
RBC: 4.22 MIL/uL (ref 3.87–5.11)
RDW: 12.2 % (ref 11.5–15.5)
WBC: 8.8 10*3/uL (ref 4.0–10.5)
nRBC: 0 % (ref 0.0–0.2)

## 2020-01-31 LAB — URINALYSIS, ROUTINE W REFLEX MICROSCOPIC
Bilirubin Urine: NEGATIVE
Glucose, UA: NEGATIVE mg/dL
Ketones, ur: 20 mg/dL — AB
Nitrite: NEGATIVE
Protein, ur: NEGATIVE mg/dL
Specific Gravity, Urine: 1.012 (ref 1.005–1.030)
pH: 5 (ref 5.0–8.0)

## 2020-01-31 LAB — COMPREHENSIVE METABOLIC PANEL
ALT: 22 U/L (ref 0–44)
AST: 23 U/L (ref 15–41)
Albumin: 5.4 g/dL — ABNORMAL HIGH (ref 3.5–5.0)
Alkaline Phosphatase: 55 U/L (ref 38–126)
Anion gap: 8 (ref 5–15)
BUN: 13 mg/dL (ref 6–20)
CO2: 22 mmol/L (ref 22–32)
Calcium: 9.1 mg/dL (ref 8.9–10.3)
Chloride: 106 mmol/L (ref 98–111)
Creatinine, Ser: 0.91 mg/dL (ref 0.44–1.00)
GFR calc Af Amer: >60 mL/min (ref >60)
GFR calc non Af Amer: >60 mL/min (ref >60)
Glucose, Bld: 95 mg/dL (ref 70–99)
Potassium: 3.8 mmol/L (ref 3.5–5.1)
Sodium: 136 mmol/L (ref 135–145)
Total Bilirubin: 0.7 mg/dL (ref 0.3–1.2)
Total Protein: 9.3 g/dL — ABNORMAL HIGH (ref 6.5–8.1)

## 2020-01-31 LAB — I-STAT BETA HCG BLOOD, ED (MC, WL, AP ONLY): I-stat hCG, quantitative: <5 m[IU]/mL (ref <5)

## 2020-01-31 LAB — LIPASE, BLOOD: Lipase: 26 U/L (ref 11–51)

## 2020-01-31 MED ORDER — SODIUM CHLORIDE 0.9% FLUSH: 3.0000 mL | Freq: Once | INTRAVENOUS | Status: DC

## 2020-01-31 NOTE — ED Triage Notes (Signed)
Pt c/o lower mid abd pain since yesterday, pt states she has litter pink discharge this morning and having urgency to urinate.

## 2020-01-31 NOTE — ED Notes (Signed)
Patient left at 3:56pm

## 2020-02-02 ENCOUNTER — Emergency Department (INDEPENDENT_AMBULATORY_CARE_PROVIDER_SITE_OTHER): Payer: BC Managed Care – PPO

## 2020-02-02 ENCOUNTER — Other Ambulatory Visit: Payer: Self-pay

## 2020-02-02 ENCOUNTER — Emergency Department
Admission: RE | Admit: 2020-02-02 | Discharge: 2020-02-02 | Disposition: A | Discharge status: Home / Self Care | Payer: BC Managed Care – PPO | Source: Ambulatory Visit | Attending: Family Medicine | Admitting: Family Medicine

## 2020-02-02 DIAGNOSIS — N858 Other specified noninflammatory disorders of uterus: Secondary | ICD-10-CM | POA: Diagnosis not present

## 2020-02-02 DIAGNOSIS — R102 Pelvic and perineal pain unspecified side: Secondary | ICD-10-CM

## 2020-02-02 DIAGNOSIS — N838 Other noninflammatory disorders of ovary, fallopian tube and broad ligament: Secondary | ICD-10-CM | POA: Diagnosis not present

## 2020-02-02 DIAGNOSIS — N9489 Other specified conditions associated with female genital organs and menstrual cycle: Secondary | ICD-10-CM

## 2020-02-02 DIAGNOSIS — N83201 Unspecified ovarian cyst, right side: Secondary | ICD-10-CM | POA: Diagnosis not present

## 2020-02-02 DIAGNOSIS — R1030 Lower abdominal pain, unspecified: Secondary | ICD-10-CM | POA: Diagnosis not present

## 2020-02-02 MED ORDER — CEPHALEXIN 500 MG PO CAPS: 500.0000 mg | ORAL_CAPSULE | Freq: Two times a day (BID) | ORAL | 0 refills | Status: DC

## 2020-02-02 NOTE — ED Provider Notes (Signed)
[facility]    CSN: [id number] Arrival date & time: 02/02/20  1348      History   Chief Complaint Abdominal Pain  HPI Lorraine Barker is a 29 y.o. female.   Patient complains of onset of lower abdominal pain at 10pm two days ago.  The day before she had noted a slight vaginal discharge.  She went to the ED last night where basic lab tests were initiated, but she left without being seen because of a long wait, and presents here today.  Her last menstrual period was July 29. She has a past history of left ovarian cyst in 2018, and right kidney stones 2015.  Review of lab tests done at the ED: I-Stat Beta HCG negative Lipase normal Minor non-specific changes on CMP Large leuks on urinalysis.     Past Medical History:  Diagnosis Date  . Kidney stone   . Ovarian cyst     Patient Active Problem List   Diagnosis Date Noted  . Irritable bowel syndrome with diarrhea 04/12/2018    Past Surgical History:  Procedure Laterality Date  . APPENDECTOMY      OB History   No obstetric history on file.      Home Medications    Prior to Admission medications   Medication Sig Start Date End Date Taking? Authorizing Provider  loratadine (CLARITIN) 10 MG tablet Take 10 mg by mouth daily as needed for allergies.   Yes [provider]  benzonatate (TESSALON) 100 MG capsule Take 1-2 capsules (100-200 mg total) by mouth 3 (three) times daily. 05/17/18   Ofilia Neas, PA-C  cephALEXin (KEFLEX) 500 MG capsule Take 1 capsule (500 mg total) by mouth 2 (two) times daily. 02/02/20   Lattie Haw, MD  hyoscyamine (LEVSIN, ANASPAZ) 0.125 MG tablet Take 1 tablet (0.125 mg total) by mouth every 6 (six) hours as needed for cramping (IBS). 04/12/18   Remus Loffler, PA-C  ibuprofen (ADVIL,MOTRIN) 600 MG tablet Take 1 tablet (600 mg total) by mouth every 8 (eight) hours as needed. 05/17/18   Ofilia Neas, PA-C  ipratropium (ATROVENT) 0.06 % nasal spray Place 2 sprays into  both nostrils 4 (four) times daily. 05/17/18   Ofilia Neas, PA-C    Family History Family History  Problem Relation Age of Onset  . Ovarian cysts Mother   . Hypertension Mother   . Kidney Stones Father   . Diverticulitis Father     Social History Social History   Tobacco Use  . Smoking status: Never Smoker  . Smokeless tobacco: Never Used  Vaping Use  . Vaping Use: Never used  Substance Use Topics  . Alcohol use: Yes    Comment: rare  . Drug use: No     Allergies   Patient has no known allergies.   Review of Systems Review of Systems  Constitutional: Positive for activity change. Negative for appetite change, chills, diaphoresis, fatigue and fever.  HENT: Negative.   Eyes: Negative.   Respiratory: Negative.   Cardiovascular: Negative.   Gastrointestinal: Positive for abdominal pain. Negative for abdominal distention, blood in stool, constipation, diarrhea, nausea and vomiting.  Genitourinary: Positive for vaginal discharge. Negative for dysuria, flank pain, frequency, genital sores, hematuria, pelvic pain, urgency and vaginal bleeding.  Musculoskeletal: Negative.   Skin: Negative.   Neurological: Negative.      Physical Exam Triage Vital Signs ED Triage Vitals [02/02/20 1411]  Enc Vitals Group     BP 126/73  Pulse 98     Resp 14     Temp 98.5 degreess     Temp src      SpO2 100%     Weight 78.2kg     Height 66ft,4in.     Head Circumference      Peak Flow      Pain Score 5     Pain Loc      Pain Edu?      Excl. in GC?    No data found.     Visual Acuity Right Eye Distance:   Left Eye Distance:   Bilateral Distance:    Right Eye Near:   Left Eye Near:    Bilateral Near:     Physical Exam Vitals and nursing note reviewed.  Constitutional:      General: She is not in acute distress.    Appearance: She is not ill-appearing.  HENT:     Head: Normocephalic.     Right Ear: Tympanic membrane normal.     Left Ear: Tympanic membrane  normal.     Nose: Nose normal.     Mouth/Throat:     Mouth: Mucous membranes are moist.     Pharynx: Oropharynx is clear.  Eyes:     Conjunctiva/sclera: Conjunctivae normal.     Pupils: Pupils are equal, round, and reactive to light.  Cardiovascular:     Rate and Rhythm: Normal rate and regular rhythm.     Heart sounds: Normal heart sounds.  Pulmonary:     Breath sounds: Normal breath sounds.  Abdominal:     General: Abdomen is flat.     Palpations: Abdomen is soft. There is no hepatomegaly or splenomegaly.     Tenderness: There is abdominal tenderness in the periumbilical area.    Musculoskeletal:     Cervical back: Neck supple.     Right lower leg: No edema.     Left lower leg: No edema.  Lymphadenopathy:     Cervical: No cervical adenopathy.  Skin:    General: Skin is warm and dry.     Findings: No rash.  Neurological:     Mental Status: She is alert and oriented to person, place, and time.      UC Treatments / Results  Labs (all labs ordered are listed, but only abnormal results are displayed) Labs Reviewed  URINE CULTURE  POCT CBC W AUTO DIFF ([facility]):  WBC 11.1; LY 17.4; MO 3.9; GR 78.7; Hgb 11.9; Platelets 249     EKG   Radiology US PELVIC COMPLETE WITH TRANSVAGINAL  Result Date: 02/02/2020 CLINICAL DATA:  Lower abdominal and pelvic pain for 3 days, history of ovarian cyst in 2018 EXAM: TRANSABDOMINAL AND TRANSVAGINAL ULTRASOUND OF PELVIS TECHNIQUE: Both transabdominal and transvaginal ultrasound examinations of the pelvis were performed. Transabdominal technique was performed for global imaging of the pelvis including uterus, ovaries, adnexal regions, and pelvic cul-de-sac. It was necessary to proceed with endovaginal exam following the transabdominal exam to visualize the uterus, endometrium, and ovaries. COMPARISON:  CT renal colic 04/19/2017 pelvic ultrasound 07/25/2015. FINDINGS: Uterus Measurements: 7.8 x 3.8 x 4.7 cm = volume: 73 mL.  Anteverted uterus. Mild heterogeneity thickening of the myometrium with punctate echogenic foci and posterior shadowing in a [place] blind type pattern which could suggest the presence of adenomyosis Endometrium Thickness: 7.7 mm, non thickened.  No focal abnormality visualized. Right ovary Measurements: 6.6 x 4.9 x 5.1 cm = volume: 8.6 mL. 5.5 x 4.5 x 4.6 cm  complex possibly solid and cystic structure arising in the right adnexa with thick nodular peripheral echogenic components and more central hypoechoic features. There is a peripheral color flow which is likely within the ovarian parenchyma but with absent internal color flow. Left ovary Measurements: 3.5 x 1.6 x 1.9 cm = volume: 6 mL. Normal appearance/no adnexal mass. Other findings No abnormal free fluid. IMPRESSION: Heterogeneous, thick-walled solid/cystic avascular structure in the right adnexa measuring up to 5.5 cm in size with peripheral color flow, has an appearance which may reflect a corpus hemorrhagicum/hemorrhagic corpus luteum cyst however the size of this focus is somewhat greater than typically expected. Could consider short-term interval follow-up to assess for progressive involution (6-12 weeks) though given patient's symptomatology, gynecologic consultation could be considered as well. Mild heterogeneity in the myometrium of the uterus with some punctate echogenic and shadowing foci have an appearance suggestive of adenomyosis. Electronically Signed   By: Kreg Shropshire M.D.   On: 02/02/2020 16:57    Procedures Procedures (including critical care time)  Medications Ordered in UC Medications - No data to display  Initial Impression / Assessment and Plan / UC Course  I have reviewed the triage vital signs and the nursing notes.  Pertinent labs & imaging results that were available during my care of the patient were reviewed by me and considered in my medical decision making (see chart for details).    Note pelvic US finding of a  heterogeneous, thick-walled solid/cystic avascular structure in the right adnexa measuring up to 5.5 cm in size with peripheral color flow, Recommend further evaluation of adnexal mass by GYN as soon as possible.  Note increased leuks on urinalysis done earlier today, and mild leukocytosis (WBC 11.1) on CBC done here.  Begin empiric Keflex and check urine culture.   Final Clinical Impressions(s) / UC Diagnoses   Final diagnoses:  Pelvic pain  Mass of uterine adnexa     Discharge Instructions     May take Ibuprofen 200mg , 4 tabs every 8 hours with food.  Increase fluid intake.  If symptoms become significantly worse during the night or over the weekend, proceed to the local emergency room.     ED Prescriptions    Medication Sig Dispense Auth. Provider   cephALEXin (KEFLEX) 500 MG capsule Take 1 capsule (500 mg total) by mouth 2 (two) times daily. 14 capsule , MD        Lattie Haw, MD 02/04/20 1328

## 2020-02-02 NOTE — Discharge Instructions (Addendum)
May take Ibuprofen 200mg, 4 tabs every 8 hours with food.  Increase fluid intake.  If symptoms become significantly worse during the night or over the weekend, proceed to the local emergency room.  

## 2020-02-02 NOTE — ED Triage Notes (Signed)
Patient presents to Urgent Care with complaints of lower pelvic pain since two days ago. Patient reports she has had a kidney stone in the past and had an ovarian cyst on the right side in 2018. Pt went to the ED last night, had basic blood work and a UA done and LWBS due to extensive wait.

## 2020-02-04 LAB — URINE CULTURE
MICRO NUMBER:: 10799245
SPECIMEN QUALITY:: ADEQUATE

## 2020-02-06 ENCOUNTER — Other Ambulatory Visit: Payer: Self-pay

## 2020-02-06 ENCOUNTER — Ambulatory Visit (INDEPENDENT_AMBULATORY_CARE_PROVIDER_SITE_OTHER): Payer: BC Managed Care – PPO

## 2020-02-06 VITALS — BP 125/75 | HR 90 | Resp 16 | Ht 64.0 in | Wt 183.0 lb

## 2020-02-06 DIAGNOSIS — Z01419 Encounter for gynecological examination (general) (routine) without abnormal findings: Secondary | ICD-10-CM | POA: Diagnosis not present

## 2020-02-06 DIAGNOSIS — Z319 Encounter for procreative management, unspecified: Secondary | ICD-10-CM

## 2020-02-06 DIAGNOSIS — N838 Other noninflammatory disorders of ovary, fallopian tube and broad ligament: Secondary | ICD-10-CM | POA: Diagnosis not present

## 2020-02-06 LAB — POCT CBC W AUTO DIFF (K'VILLE URGENT CARE)

## 2020-02-06 NOTE — Progress Notes (Signed)
GYNECOLOGY ANNUAL PREVENTATIVE CARE ENCOUNTER NOTE  History:     Lorraine Barker is a 29 y.o. G0P0000 female here for a routine annual gynecologic exam.  Current complaints: right sided pelvic pain. She was seen at Urgent Care on 8/6 and diagnosed with a right ovarian cyst.   Denies abnormal vaginal bleeding, discharge, pelvic pain, problems with intercourse or other gynecologic concerns.    Reviewed pelvic pain history with patient. She states this right sided pain happens every couple of years but when it comes, it is severe. She rates the pain a 10/10 when it happens. She reports the last time it was this severe was in 2017/2018. She reports heavy periods that last 4-6 days and passes large clots. She reports cramping with her periods but states the pain is mild.   She has been trying to conceive for 1 year. Strongly desires pregnancy.     Gynecologic History Patient's last menstrual period was 01/26/2020. Contraception: none Last Pap: 2019. Results were: normal with negative HPV  Obstetric History OB History  Gravida Para Term Preterm AB Living  0 0 0 0 0 0  SAB TAB Ectopic Multiple Live Births  0 0 0 0 0    Past Medical History:  Diagnosis Date  . Kidney stone   . Ovarian cyst     Past Surgical History:  Procedure Laterality Date  . APPENDECTOMY    . WISDOM TOOTH EXTRACTION      Current Outpatient Medications on File Prior to Visit  Medication Sig Dispense Refill  . cephALEXin (KEFLEX) 500 MG capsule Take 1 capsule (500 mg total) by mouth 2 (two) times daily. 14 capsule 0  . ibuprofen (ADVIL,MOTRIN) 600 MG tablet Take 1 tablet (600 mg total) by mouth every 8 (eight) hours as needed. 30 tablet 0  . loratadine (CLARITIN) 10 MG tablet Take 10 mg by mouth daily as needed for allergies.    . hyoscyamine (LEVSIN, ANASPAZ) 0.125 MG tablet Take 1 tablet (0.125 mg total) by mouth every 6 (six) hours as needed for cramping (IBS). (Patient not taking: Reported on 02/06/2020) 30  tablet 5  . ipratropium (ATROVENT) 0.06 % nasal spray Place 2 sprays into both nostrils 4 (four) times daily. (Patient not taking: Reported on 02/06/2020) 15 mL 12   No current facility-administered medications on file prior to visit.    No Known Allergies  Social History:  reports that she has never smoked. She has never used smokeless tobacco. She reports current alcohol use. She reports that she does not use drugs.  Family History  Problem Relation Age of Onset  . Ovarian cysts Mother   . Hypertension Mother   . Kidney Stones Mother   . Kidney Stones Father   . Diverticulitis Father     The following portions of the patient's history were reviewed and updated as appropriate: allergies, current medications, past family history, past medical history, past social history, past surgical history and problem list.  Review of Systems Pertinent items noted in HPI and remainder of comprehensive ROS otherwise negative.  Physical Exam:  BP 125/75   Pulse 90   Resp 16   Ht 5\' 4"  (1.626 m)   Wt 183 lb (83 kg)   LMP 01/26/2020   BMI 31.41 kg/m  CONSTITUTIONAL: Well-developed, well-nourished female in no acute distress.  HENT:  Normocephalic, atraumatic, External right and left ear normal. Oropharynx is clear and moist EYES: Conjunctivae and EOM are normal. Pupils are equal, round, and reactive to  light. No scleral icterus.  NECK: Normal range of motion, supple, no masses.  Normal thyroid.  SKIN: Skin is warm and dry. No rash noted. Not diaphoretic. No erythema. No pallor. MUSCULOSKELETAL: Normal range of motion. No tenderness.  No cyanosis, clubbing, or edema.  2+ distal pulses. NEUROLOGIC: Alert and oriented to person, place, and time. Normal reflexes, muscle tone coordination.  PSYCHIATRIC: Normal mood and affect. Normal behavior. Normal judgment and thought content. CARDIOVASCULAR: Normal heart rate noted, regular rhythm RESPIRATORY: Clear to auscultation bilaterally. Effort and  breath sounds normal, no problems with respiration noted. BREASTS: Symmetric in size. No masses, tenderness, skin changes, nipple drainage, or lymphadenopathy bilaterally. Performed in the presence of a chaperone. ABDOMEN: Soft, no distention noted.  No tenderness, rebound or guarding.  PELVIC: Normal appearing external genitalia and urethral meatus; normal appearing vaginal mucosa and cervix.  No abnormal discharge noted. Slightly enlarged uterus, boggy on palpation but no tenderness. Tenderness on right adnexa with palpable mass.  Performed in the presence of a chaperone.     Recent Results (from the past 2160 hour(s))  Urinalysis, Routine w reflex microscopic     Status: Abnormal   Collection Time: 01/31/20 12:53 PM  Result Value Ref Range   Color, Urine STRAW (A) YELLOW   APPearance CLEAR CLEAR   Specific Gravity, Urine 1.012 1.005 - 1.030   pH 5.0 5.0 - 8.0   Glucose, UA NEGATIVE NEGATIVE mg/dL   Hgb urine dipstick SMALL (A) NEGATIVE   Bilirubin Urine NEGATIVE NEGATIVE   Ketones, ur 20 (A) NEGATIVE mg/dL   Protein, ur NEGATIVE NEGATIVE mg/dL   Nitrite NEGATIVE NEGATIVE   Leukocytes,Ua LARGE (A) NEGATIVE   RBC / HPF 6-10 0 - 5 RBC/hpf   WBC, UA 0-5 0 - 5 WBC/hpf   Bacteria, UA RARE (A) NONE SEEN   Squamous Epithelial / LPF 0-5 0 - 5    Comment: Performed at [facility], [address] [place], [place] [address]  Lipase, blood     Status: None   Collection Time: 01/31/20  1:00 PM  Result Value Ref Range   Lipase 26 11 - 51 U/L    Comment: Performed at [facility], [address] [place], [place] [address]  Comprehensive metabolic panel     Status: Abnormal   Collection Time: 01/31/20  1:00 PM  Result Value Ref Range   Sodium 136 135 - 145 mmol/L   Potassium 3.8 3.5 - 5.1 mmol/L   Chloride 106 98 - 111 mmol/L   CO2 22 22 - 32 mmol/L   Glucose, Bld 95 70 - 99 mg/dL    Comment: Glucose reference range applies only to samples taken after fasting for at least 8  hours.   BUN 13 6 - 20 mg/dL   Creatinine, Ser 6.960.91 0.44 - 1.00 mg/dL   Calcium 9.1 8.9 - 29.510.3 mg/dL   Total Protein 9.3 (H) 6.5 - 8.1 g/dL   Albumin 5.4 (H) 3.5 - 5.0 g/dL   AST 23 15 - 41 U/L   ALT 22 0 - 44 U/L   Alkaline Phosphatase 55 38 - 126 U/L   Total Bilirubin 0.7 0.3 - 1.2 mg/dL   GFR calc non Af Amer >60 >60 mL/min   GFR calc Af Amer >60 >60 mL/min   Anion gap 8 5 - 15    Comment: Performed at [facility], [address] [place], [place] [address]  CBC     Status: None  Collection Time: 01/31/20  1:00 PM  Result Value Ref Range   WBC 8.8 4.0 - 10.5 K/uL   RBC 4.22 3.87 - 5.11 MIL/uL   Hemoglobin 12.1 12.0 - 15.0 g/dL   HCT 84.6 36 - 46 %   MCV 89.3 80.0 - 100.0 fL   MCH 28.7 26.0 - 34.0 pg   MCHC 32.1 30.0 - 36.0 g/dL   RDW 96.2 95.2 - 84.1 %   Platelets 233 150 - 400 K/uL   nRBC 0.0 0.0 - 0.2 %    Comment: Performed at [facility], [address], [place], [place] [address]  I-Stat beta hCG blood, ED     Status: None   Collection Time: 01/31/20  1:57 PM  Result Value Ref Range   I-stat hCG, quantitative <5.0 <5 mIU/mL   Comment 3            Comment:   GEST. AGE      CONC.  (mIU/mL)   <=1 WEEK        5 - 50     2 WEEKS       50 - 500     3 WEEKS       100 - 10,000     4 WEEKS     1,000 - 30,000        FEMALE AND NON-PREGNANT FEMALE:     LESS THAN 5 mIU/mL   Urine culture     Status: Abnormal   Collection Time: 02/02/20  6:36 PM   Specimen: Urine, Clean Catch  Result Value Ref Range   MICRO NUMBER: [id number]    SPECIMEN QUALITY: Adequate    Sample Source URINE    STATUS: FINAL    ISOLATE 1: Streptococcus agalactiae (A)     Comment: 1,000-9,000 CFU/ML of Group B Streptococcus isolated Beta-hemolytic streptococci are predictably susceptible to Penicillin and other beta-lactams. Susceptibility testing not routinely performed. Please contact the laboratory within 3 days if  susceptibility testing is desired. Erythromycin and clindamycin are not  recommended for treatment of urinary tract infections, but clindamycin may be useful for treatment in penicillin allergic patients for rectovaginal colonization or for intrapartum  prophylaxis if indicated. Any amount of group B Streptococcus in urine specimens obtained from pregnant females is a marker of genital tract colonization. If this patient is pregnant, please refer to ACOG guidelines for appropriate screening and  management of pregnant women.    ISOLATE 2: Staphylococcus aureus (A)     Comment: 10,000-49,000 CFU/mL of Staphylococcus aureus      Susceptibility   Staphylococcus aureus - URINE CULTURE POSITIVE 2    VANCOMYCIN 1 Sensitive     CIPROFLOXACIN <=0.5 Sensitive     LEVOFLOXACIN <=0.12 Sensitive     GENTAMICIN <=0.5 Sensitive     NITROFURANTOIN 32 Sensitive     OXACILLIN* 0.5 Sensitive      * Oxacillin-susceptible staphylococci aresusceptible to other penicillinase-stablepenicillins (e.g. Methicillin, Nafcillin), beta-lactam/beta-lactamase inhibitor combinations, andcephems with staphylococcal indications, includingCefazolin.    TETRACYCLINE <=1 Sensitive     TRIMETH/SULFA* <=10 Sensitive      * Oxacillin-susceptible staphylococci aresusceptible to other penicillinase-stablepenicillins (e.g. Methicillin, Nafcillin), beta-lactam/beta-lactamase inhibitor combinations, andcephems with staphylococcal indications, includingCefazolin.Legend:S = Susceptible  I = IntermediateR = Resistant  NS = Not susceptible* = Not tested  NR = Not reported**NN = See antimicrobic comments   US PELVIC COMPLETE WITH TRANSVAGINAL  Result Date: 02/02/2020 CLINICAL DATA:  Lower abdominal and pelvic pain for 3 days, history of ovarian  cyst in 2018 EXAM: TRANSABDOMINAL AND TRANSVAGINAL ULTRASOUND OF PELVIS TECHNIQUE: Both transabdominal and transvaginal ultrasound examinations of the pelvis were performed. Transabdominal technique was performed for global imaging of the pelvis including uterus, ovaries,  adnexal regions, and pelvic cul-de-sac. It was necessary to proceed with endovaginal exam following the transabdominal exam to visualize the uterus, endometrium, and ovaries. COMPARISON:  CT renal colic 04/19/2017 pelvic ultrasound 07/25/2015. FINDINGS: Uterus Measurements: 7.8 x 3.8 x 4.7 cm = volume: 73 mL. Anteverted uterus. Mild heterogeneity thickening of the myometrium with punctate echogenic foci and posterior shadowing in a [place] blind type pattern which could suggest the presence of adenomyosis Endometrium Thickness: 7.7 mm, non thickened.  No focal abnormality visualized. Right ovary Measurements: 6.6 x 4.9 x 5.1 cm = volume: 8.6 mL. 5.5 x 4.5 x 4.6 cm complex possibly solid and cystic structure arising in the right adnexa with thick nodular peripheral echogenic components and more central hypoechoic features. There is a peripheral color flow which is likely within the ovarian parenchyma but with absent internal color flow. Left ovary Measurements: 3.5 x 1.6 x 1.9 cm = volume: 6 mL. Normal appearance/no adnexal mass. Other findings No abnormal free fluid. IMPRESSION: Heterogeneous, thick-walled solid/cystic avascular structure in the right adnexa measuring up to 5.5 cm in size with peripheral color flow, has an appearance which may reflect a corpus hemorrhagicum/hemorrhagic corpus luteum cyst however the size of this focus is somewhat greater than typically expected. Could consider short-term interval follow-up to assess for progressive involution (6-12 weeks) though given patient's symptomatology, gynecologic consultation could be considered as well. Mild heterogeneity in the myometrium of the uterus with some punctate echogenic and shadowing foci have an appearance suggestive of adenomyosis. Electronically Signed   By: Kreg Shropshire M.D.   On: 02/02/2020 16:57   Assessment and Plan:  1. Well woman exam with routine gynecological exam -Pap smear up to date -Reviewed results from urgent care visit  at length. Labs normal. Ultrasound detailed above. After chart reivew, right ovarian mass noted on CT scan in 2017, similar size as seen on this ultrasound. Possible adenomyosis noted on ultrasound.  -Discussed possible implications of results of ultrasound with patient and husband at length. Patient symptoms consistent with possible adenomyosis and discussed how this is diagnosed. Will repeat ultrasound imaging with doppler flow for better pictures and evaluation of endometrium and adnexal mass. Will schedule with MD for follow up and plan of care management.   - CA 125 - US PELVIC COMPLETE W TRANSVAGINAL AND TORSION R/O; Future  2. Ovarian mass, right   3. Patient desires pregnancy -Patient and husband have been trying to conceive for 1 year without success. Discussed with patient evaluation of pelvic mass and endometrium to determine if this could be cause of inability to conceive. Will discuss with MD at next visit. Reviewed infertility options in the area as well.   [name], CNM 02/06/20 10:15 AM

## 2020-02-07 LAB — CA 125: CA 125: 8 U/mL (ref <35)

## 2020-02-08 ENCOUNTER — Encounter: Payer: Self-pay | Admitting: *Deleted

## 2020-02-16 ENCOUNTER — Ambulatory Visit: Payer: BC Managed Care – PPO | Admitting: Obstetrics and Gynecology

## 2020-02-19 ENCOUNTER — Ambulatory Visit (INDEPENDENT_AMBULATORY_CARE_PROVIDER_SITE_OTHER): Payer: BC Managed Care – PPO

## 2020-02-19 ENCOUNTER — Other Ambulatory Visit: Payer: Self-pay

## 2020-02-19 DIAGNOSIS — Z01419 Encounter for gynecological examination (general) (routine) without abnormal findings: Secondary | ICD-10-CM | POA: Diagnosis not present

## 2020-02-19 DIAGNOSIS — R102 Pelvic and perineal pain: Secondary | ICD-10-CM | POA: Diagnosis not present

## 2020-02-19 DIAGNOSIS — N83201 Unspecified ovarian cyst, right side: Secondary | ICD-10-CM | POA: Diagnosis not present

## 2020-02-26 ENCOUNTER — Other Ambulatory Visit: Payer: Self-pay

## 2020-02-26 ENCOUNTER — Encounter: Payer: Self-pay | Admitting: Obstetrics and Gynecology

## 2020-02-26 ENCOUNTER — Ambulatory Visit: Payer: BC Managed Care – PPO | Admitting: Obstetrics and Gynecology

## 2020-02-26 VITALS — BP 111/71 | HR 88 | Resp 16 | Ht 64.0 in | Wt 183.0 lb

## 2020-02-26 DIAGNOSIS — N83201 Unspecified ovarian cyst, right side: Secondary | ICD-10-CM | POA: Diagnosis not present

## 2020-02-26 DIAGNOSIS — Z3169 Encounter for other general counseling and advice on procreation: Secondary | ICD-10-CM

## 2020-02-26 NOTE — Progress Notes (Signed)
GYNECOLOGY OFFICE FOLLOW UP NOTE  History:  29 y.o. G0P0000 here today for follow up for infertility. Patient has been trying to get pregnant for 1 year. Cycles every 28 days, lasting 4-5 days, starting heavy and then get light. Husband is 6, was put on testosterone for low T. Unsure about seme ananylis. He does not have any other children. She has been having unprotected intercourse around the time she ovulates. Has been using [employer] ovulation predictor kits and reports that she ovulates every month.    Has not had any further pelvic pain on right side.  Past Medical History:  Diagnosis Date  . Kidney stone   . Ovarian cyst     Past Surgical History:  Procedure Laterality Date  . APPENDECTOMY    . WISDOM TOOTH EXTRACTION       Current Outpatient Medications:  .  ibuprofen (ADVIL,MOTRIN) 600 MG tablet, Take 1 tablet (600 mg total) by mouth every 8 (eight) hours as needed., Disp: 30 tablet, Rfl: 0 .  loratadine (CLARITIN) 10 MG tablet, Take 10 mg by mouth daily as needed for allergies., Disp: , Rfl:  .  cephALEXin (KEFLEX) 500 MG capsule, Take 1 capsule (500 mg total) by mouth 2 (two) times daily. (Patient not taking: Reported on 02/26/2020), Disp: 14 capsule, Rfl: 0 .  hyoscyamine (LEVSIN, ANASPAZ) 0.125 MG tablet, Take 1 tablet (0.125 mg total) by mouth every 6 (six) hours as needed for cramping (IBS). (Patient not taking: Reported on 02/06/2020), Disp: 30 tablet, Rfl: 5 .  ipratropium (ATROVENT) 0.06 % nasal spray, Place 2 sprays into both nostrils 4 (four) times daily. (Patient not taking: Reported on 02/06/2020), Disp: 15 mL, Rfl: 12  The following portions of the patient's history were reviewed and updated as appropriate: allergies, current medications, past family history, past medical history, past social history, past surgical history and problem list.   Review of Systems:  Pertinent items noted in HPI and remainder of comprehensive ROS otherwise negative.   Objective:    Physical Exam BP 111/71   Pulse 88   Resp 16   Ht 5\' 4"  (1.626 m)   Wt 183 lb (83 kg)   LMP 02/11/2020   BMI 31.41 kg/m  CONSTITUTIONAL: Well-developed, well-nourished female in no acute distress.  HENT:  Normocephalic, atraumatic. External right and left ear normal. Oropharynx is clear and moist EYES: Conjunctivae and EOM are normal. Pupils are equal, round, and reactive to light. No scleral icterus.  NECK: Normal range of motion, supple, no masses SKIN: Skin is warm and dry. No rash noted. Not diaphoretic. No erythema. No pallor. NEUROLOGIC: Alert and oriented to person, place, and time. Normal reflexes, muscle tone coordination. No cranial nerve deficit noted. PSYCHIATRIC: Normal mood and affect. Normal behavior. Normal judgment and thought content. CARDIOVASCULAR: Normal heart rate noted RESPIRATORY: Effort normal, no problems with respiration noted ABDOMEN: Soft, no distention noted.   PELVIC: deferred MUSCULOSKELETAL: Normal range of motion. No edema noted.  Labs and Imaging 02/13/2020 PELVIC COMPLETE WITH TRANSVAGINAL  Result Date: 02/02/2020 CLINICAL DATA:  Lower abdominal and pelvic pain for 3 days, history of ovarian cyst in 2018 EXAM: TRANSABDOMINAL AND TRANSVAGINAL ULTRASOUND OF PELVIS TECHNIQUE: Both transabdominal and transvaginal ultrasound examinations of the pelvis were performed. Transabdominal technique was performed for global imaging of the pelvis including uterus, ovaries, adnexal regions, and pelvic cul-de-sac. It was necessary to proceed with endovaginal exam following the transabdominal exam to visualize the uterus, endometrium, and ovaries. COMPARISON:  CT renal colic 04/19/2017  pelvic ultrasound 07/25/2015. FINDINGS: Uterus Measurements: 7.8 x 3.8 x 4.7 cm = volume: 73 mL. Anteverted uterus. Mild heterogeneity thickening of the myometrium with punctate echogenic foci and posterior shadowing in a [place] blind type pattern which could suggest the presence of adenomyosis  Endometrium Thickness: 7.7 mm, non thickened.  No focal abnormality visualized. Right ovary Measurements: 6.6 x 4.9 x 5.1 cm = volume: 8.6 mL. 5.5 x 4.5 x 4.6 cm complex possibly solid and cystic structure arising in the right adnexa with thick nodular peripheral echogenic components and more central hypoechoic features. There is a peripheral color flow which is likely within the ovarian parenchyma but with absent internal color flow. Left ovary Measurements: 3.5 x 1.6 x 1.9 cm = volume: 6 mL. Normal appearance/no adnexal mass. Other findings No abnormal free fluid. IMPRESSION: Heterogeneous, thick-walled solid/cystic avascular structure in the right adnexa measuring up to 5.5 cm in size with peripheral color flow, has an appearance which may reflect a corpus hemorrhagicum/hemorrhagic corpus luteum cyst however the size of this focus is somewhat greater than typically expected. Could consider short-term interval follow-up to assess for progressive involution (6-12 weeks) though given patient's symptomatology, gynecologic consultation could be considered as well. Mild heterogeneity in the myometrium of the uterus with some punctate echogenic and shadowing foci have an appearance suggestive of adenomyosis. Electronically Signed   By: Kreg Shropshire M.D.   On: 02/02/2020 16:57   US PELVIC COMPLETE W TRANSVAGINAL AND TORSION R/O  Result Date: 02/19/2020 CLINICAL DATA:  Pelvic pain, large ovarian cyst, LMP 01/26/2020 EXAM: TRANSABDOMINAL AND TRANSVAGINAL ULTRASOUND OF PELVIS DOPPLER ULTRASOUND OF OVARIES TECHNIQUE: Both transabdominal and transvaginal ultrasound examinations of the pelvis were performed. Transabdominal technique was performed for global imaging of the pelvis including uterus, ovaries, adnexal regions, and pelvic cul-de-sac. It was necessary to proceed with endovaginal exam following the transabdominal exam to visualize the uterus, endometrium, and ovaries. Color and duplex Doppler ultrasound was  utilized to evaluate blood flow to the ovaries. COMPARISON:  Pelvic ultrasound 02/02/2020 FINDINGS: Uterus Measurements: 8.4 x 3.7 x 5.7 cm = volume: 92 mL. Anteverted. Mildly heterogeneous myometrium without focal mass Endometrium Thickness: 6 mm.  No endometrial fluid or focal abnormality Right ovary Measurements: 4.7 x 2.9 x 3.5 cm = volume: 25 mL. Complicated hypoechoic nodule within RIGHT ovary 3.3 x 2.3 x 2.8 cm, containing heterogeneous internal echogenicity, minimal complex fluid and few thin septations. Appearance favors a hemorrhagic cyst. Lesion has decreased in size since the previous exam and appears less echogenic. Blood flow present within RIGHT ovary on color Doppler imaging. Left ovary Measurements: 3.6 x 1.8 x 1.9 cm = volume: 6 mL. Dominant physiologic follicle without additional mass Pulsed Doppler evaluation of both ovaries demonstrates low resistance arterial and venous waveforms within both ovaries. Other findings No free pelvic fluid or adnexal masses. IMPRESSION: Interval significant decrease in size of complicated lesion within the RIGHT ovary mild 3.3 cm greatest diameter previously 5.5 cm, likely an evolving hemorrhagic cyst; the decrease in size favors a non neoplastic cyst and no follow-up imaging is recommended. Reference: Radiology 2019 Nov; 293(2):359-371. No new pelvic sonographic abnormalities identified. Electronically Signed   By: Ulyses Southward M.D.   On: 02/19/2020 15:36    Assessment & Plan:   1. Infertility counseling - Pt has been trying to get pregnant for one year, has confirmed ovulation via predictor kits and has been timing intercourse  - reviewed recommendation to get basic labs - reviewed that it may take up to  a year for couples to achieve pregnancy and after one year, would recommend assessment, possible referral to REI - pt unsure about REI referral at this point, agreeable to labs -  Will get labs and base further management on results - Follicle  stimulating hormone - Anti mullerian hormone - TSH - Hemoglobin A1c - Prolactin - Progesterone  2. Hemorrhagic cyst of right ovary No further pain - reviewed appearance of hemorrhagic cyst, etiology, course - no further management needed   Routine preventative health maintenance measures emphasized. Please refer to After Visit Summary for other counseling recommendations.   Return for will contact patient with results for follow up.  Total face-to-face time with patient: 22 minutes. Over 50% of encounter was spent on counseling and coordination of care.  Baldemar Lenis, M.D. [facility]ending [facility] [employer])

## 2020-02-29 LAB — PROGESTERONE: Progesterone: 6.8 ng/mL

## 2020-02-29 LAB — FOLLICLE STIMULATING HORMONE: FSH: 6.1 m[IU]/mL

## 2020-02-29 LAB — TSH: TSH: 1.06 mIU/L

## 2020-02-29 LAB — ANTI-MULLERIAN HORMONE (AMH), FEMALE: Anti-Mullerian Hormones(AMH), Female: 0.57 ng/mL — ABNORMAL LOW (ref 0.69–13.39)

## 2020-02-29 LAB — HEMOGLOBIN A1C
Hgb A1c MFr Bld: 4.9 % of total Hgb (ref <5.7)
Mean Plasma Glucose: 94 (calc)
eAG (mmol/L): 5.2 (calc)

## 2020-02-29 LAB — PROLACTIN: Prolactin: 6.4 ng/mL

## 2020-03-07 ENCOUNTER — Telehealth: Payer: Self-pay | Admitting: Obstetrics and Gynecology

## 2020-03-07 NOTE — Telephone Encounter (Signed)
Called patient to review results, no answer, left message. Will try again.   Baldemar Lenis, M.D. [facility] [employer])

## 2020-06-24 ENCOUNTER — Telehealth: Payer: Self-pay | Admitting: *Deleted

## 2020-06-24 NOTE — Telephone Encounter (Signed)
Returned call from [employer]. Left patient a message to call and schedule appointment.

## 2020-06-26 ENCOUNTER — Ambulatory Visit: Payer: BC Managed Care – PPO

## 2020-06-29 NOTE — L&D Delivery Note (Signed)
Lorraine Barker is a 30 y.o. female G1P0000 with IUP at [redacted]w[redacted]d admitted for SROM .  She progressed with pitocin augmentation to complete and pushed 30 minutes to deliver.  Cord clamping delayed by several minutes then clamped by CNM and cut by FOB.    Delivery Note At 8:36 AM a viable female was delivered via Vaginal, Spontaneous (Presentation: Left Occiput Anterior).  APGAR: 8, 9; weight  .   Placenta status: Spontaneous, Abnormal;Intact.  Cord: 3 vessels with the following complications: compound hand and nuchal x1  Anesthesia: Epidural Episiotomy: None Lacerations: 2nd degree Suture Repair: 3.0 vicryl Est. Blood Loss (mL): 50  Mom to postpartum.  Baby to Couplet care / Skin to Skin.  [name] CNM 02/17/2021, 8:51 AM

## 2020-07-01 ENCOUNTER — Other Ambulatory Visit: Payer: Self-pay

## 2020-07-01 ENCOUNTER — Ambulatory Visit (INDEPENDENT_AMBULATORY_CARE_PROVIDER_SITE_OTHER): Payer: BC Managed Care – PPO | Admitting: *Deleted

## 2020-07-01 DIAGNOSIS — Z3A01 Less than 8 weeks gestation of pregnancy: Secondary | ICD-10-CM

## 2020-07-01 DIAGNOSIS — Z3200 Encounter for pregnancy test, result unknown: Secondary | ICD-10-CM

## 2020-07-01 NOTE — Progress Notes (Signed)
Bedside U/S shows single IUP with  Positive FHT.  CRL measures 4.63mm  GA [redacted]w[redacted]d.  Pt was unsure of dates but todays U/S shows GA of [redacted]w[redacted]d.  She will return in 3 weeks for NOB

## 2020-07-17 NOTE — Progress Notes (Signed)
Last pap- 04/12/18- negative

## 2020-07-19 ENCOUNTER — Other Ambulatory Visit: Payer: Self-pay

## 2020-07-19 ENCOUNTER — Other Ambulatory Visit (HOSPITAL_COMMUNITY)
Admission: RE | Admit: 2020-07-19 | Discharge: 2020-07-19 | Disposition: A | Discharge status: Home / Self Care | Payer: BC Managed Care – PPO | Source: Ambulatory Visit | Attending: Obstetrics and Gynecology | Admitting: Obstetrics and Gynecology

## 2020-07-19 ENCOUNTER — Ambulatory Visit (INDEPENDENT_AMBULATORY_CARE_PROVIDER_SITE_OTHER): Payer: BC Managed Care – PPO | Admitting: Obstetrics and Gynecology

## 2020-07-19 VITALS — BP 114/69 | HR 76 | Wt 188.0 lb

## 2020-07-19 DIAGNOSIS — E669 Obesity, unspecified: Secondary | ICD-10-CM | POA: Diagnosis not present

## 2020-07-19 DIAGNOSIS — E66811 Obesity, class 1: Secondary | ICD-10-CM | POA: Insufficient documentation

## 2020-07-19 DIAGNOSIS — Z3A38 38 weeks gestation of pregnancy: Secondary | ICD-10-CM | POA: Diagnosis not present

## 2020-07-19 DIAGNOSIS — Z3490 Encounter for supervision of normal pregnancy, unspecified, unspecified trimester: Secondary | ICD-10-CM | POA: Insufficient documentation

## 2020-07-19 DIAGNOSIS — Z3401 Encounter for supervision of normal first pregnancy, first trimester: Secondary | ICD-10-CM

## 2020-07-19 MED ORDER — ONDANSETRON 8 MG PO TBDP
8.0000 mg | ORAL_TABLET | Freq: Three times a day (TID) | ORAL | 2 refills | Status: DC | PRN
Start: 2020-07-19 — End: 2020-11-29

## 2020-07-19 MED ORDER — ASPIRIN EC 81 MG PO TBEC
81.0000 mg | DELAYED_RELEASE_TABLET | Freq: Every day | ORAL | 11 refills | Status: DC
Start: 2020-07-19 — End: 2021-02-18

## 2020-07-19 NOTE — Patient Instructions (Signed)
https://www.cdc.gov/pregnancy/infections.html">  First Trimester of Pregnancy  The first trimester of pregnancy starts on the first day of your last menstrual period until the end of week 12. This is also called months 1 through 3 of pregnancy. Body changes during your first trimester Your body goes through many changes during pregnancy. The changes usually return to normal after your baby is born. Physical changes  You may gain or lose weight.  Your breasts may grow larger and hurt. The area around your nipples may get darker.  Dark spots or blotches may develop on your face.  You may have changes in your hair. Health changes  You may feel like you might vomit (nauseous), and you may vomit.  You may have heartburn.  You may have headaches.  You may have trouble pooping (constipation).  Your gums may bleed. Other changes  You may get tired easily.  You may pee (urinate) more often.  Your menstrual periods will stop.  You may not feel hungry.  You may want to eat certain kinds of food.  You may have changes in your emotions from day to day.  You may have more dreams. Follow these instructions at home: Medicines  Take over-the-counter and prescription medicines only as told by your doctor. Some medicines are not safe during pregnancy.  Take a prenatal vitamin that contains at least 600 micrograms (mcg) of folic acid. Eating and drinking  Eat healthy meals that include: ? Fresh fruits and vegetables. ? Whole grains. ? Good sources of protein, such as meat, eggs, or tofu. ? Low-fat dairy products.  Avoid raw meat and unpasteurized juice, milk, and cheese.  If you feel like you may vomit, or you vomit: ? Eat 4 or 5 small meals a day instead of 3 large meals. ? Try eating a few soda crackers. ? Drink liquids between meals instead of during meals.  You may need to take these actions to prevent or treat trouble pooping: ? Drink enough fluids to keep your pee  (urine) pale yellow. ? Eat foods that are high in fiber. These include beans, whole grains, and fresh fruits and vegetables. ? Limit foods that are high in fat and sugar. These include fried or sweet foods. Activity  Exercise only as told by your doctor. Most people can do their usual exercise routine during pregnancy.  Stop exercising if you have cramps or pain in your lower belly (abdomen) or low back.  Do not exercise if it is too hot or too humid, or if you are in a place of great height (high altitude).  Avoid heavy lifting.  If you choose to, you may have sex unless your doctor tells you not to. Relieving pain and discomfort  Wear a good support bra if your breasts are sore.  Rest with your legs raised (elevated) if you have leg cramps or low back pain.  If you have bulging veins (varicose veins) in your legs: ? Wear support hose as told by your doctor. ? Raise your feet for 15 minutes, 3-4 times a day. ? Limit salt in your food. Safety  Wear your seat belt at all times when you are in a car.  Talk with your doctor if someone is hurting you or yelling at you.  Talk with your doctor if you are feeling sad or have thoughts of hurting yourself. Lifestyle  Do not use hot tubs, steam rooms, or saunas.  Do not douche. Do not use tampons or scented sanitary pads.  Do not   use herbal medicines, illegal drugs, or medicines that are not approved by your doctor. Do not drink alcohol.  Do not smoke or use any products that contain nicotine or tobacco. If you need help quitting, ask your doctor.  Avoid cat litter boxes and soil that is used by cats. These carry germs that can cause harm to the baby and can cause a loss of your baby by miscarriage or stillbirth. General instructions  Keep all follow-up visits. This is important.  Ask for help if you need counseling or if you need help with nutrition. Your doctor can give you advice or tell you where to go for help.  Visit your  dentist. At home, brush your teeth with a soft toothbrush. Floss gently.  Write down your questions. Take them to your prenatal visits. Where to find more information  American Pregnancy Association: americanpregnancy.org  American College of Obstetricians and Gynecologists: www.acog.org  Office on Women's Health: womenshealth.gov/pregnancy Contact a doctor if:  You are dizzy.  You have a fever.  You have mild cramps or pressure in your lower belly.  You have a nagging pain in your belly area.  You continue to feel like you may vomit, you vomit, or you have watery poop (diarrhea) for 24 hours or longer.  You have a bad-smelling fluid coming from your vagina.  You have pain when you pee.  You are exposed to a disease that spreads from person to person, such as chickenpox, measles, Zika virus, HIV, or hepatitis. Get help right away if:  You have spotting or bleeding from your vagina.  You have very bad belly cramping or pain.  You have shortness of breath or chest pain.  You have any kind of injury, such as from a fall or a car crash.  You have new or increased pain, swelling, or redness in an arm or leg. Summary  The first trimester of pregnancy starts on the first day of your last menstrual period until the end of week 12 (months 1 through 3).  Eat 4 or 5 small meals a day instead of 3 large meals.  Do not smoke or use any products that contain nicotine or tobacco. If you need help quitting, ask your doctor.  Keep all follow-up visits. This information is not intended to replace advice given to you by your health care provider. Make sure you discuss any questions you have with your health care provider. Document Revised: 11/22/2019 Document Reviewed: 09/28/2019 Elsevier Patient Education  2021 Elsevier Inc.  

## 2020-07-19 NOTE — Progress Notes (Signed)
Bedside U/S shows single IUP with FHT of 172 BPM and CRL measures 23.54mm GA 9w

## 2020-07-19 NOTE — Progress Notes (Signed)
History:   Lorraine Barker is a 30 y.o. G1P0000 at [redacted]w[redacted]d by LMP being seen today for her first obstetrical visit.  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 Patient does intend to breast feed. Pregnancy history fully reviewed. Desired pregnancy, partner present today Ebony Cargo) Nausea started last night, vomited one time.   Patient reports no complaints. Vaccinated against Covid-19   HISTORY: OB History  Gravida Para Term Preterm AB Living  1 0 0 0 0 0  SAB IAB Ectopic Multiple Live Births  0 0 0 0 0    # Outcome Date GA Lbr Len/2nd Weight Sex Delivery Anes PTL Lv  1 Current             Last pap smear was done 2019 and was normal  Past Medical History:  Diagnosis Date   Kidney stone    Ovarian cyst    Past Surgical History:  Procedure Laterality Date   APPENDECTOMY     WISDOM TOOTH EXTRACTION     Family History  Problem Relation Age of Onset   Ovarian cysts Mother    Hypertension Mother    Kidney Stones Mother    Kidney Stones Father    Diverticulitis Father    Social History   Tobacco Use   Smoking status: Never Smoker   Smokeless tobacco: Never Used  Vaping Use   Vaping Use: Never used  Substance Use Topics   Alcohol use: Not Currently    Comment: rare   Drug use: No   No Known Allergies Current Outpatient Medications on File Prior to Visit  Medication Sig Dispense Refill   Prenatal Vit-Fe Fumarate-FA (MULTIVITAMIN-PRENATAL) 27-0.8 MG TABS tablet Take 1 tablet by mouth daily at 12 noon.     hyoscyamine (LEVSIN, ANASPAZ) 0.125 MG tablet Take 1 tablet (0.125 mg total) by mouth every 6 (six) hours as needed for cramping (IBS). (Patient not taking: No sig reported) 30 tablet 5   ipratropium (ATROVENT) 0.06 % nasal spray Place 2 sprays into both nostrils 4 (four) times daily. (Patient not taking: No sig reported) 15 mL 12   loratadine (CLARITIN) 10 MG tablet Take 10 mg by mouth daily as needed for allergies. (Patient not  taking: Reported on 07/17/2020)     No current facility-administered medications on file prior to visit.    Review of Systems Pertinent items noted in HPI and remainder of comprehensive ROS otherwise negative.  Physical Exam:   Vitals:   07/17/20 0920  BP: 114/69  Pulse: 76  Weight: 188 lb (85.3 kg)   Fetal Heart Rate (bpm): 172 Bedside Ultrasound for FHR check: Viable intrauterine pregnancy with positive cardiac activity noted, fetal heart rate  172 bpm Patient informed that the ultrasound is considered a limited obstetric ultrasound and is not intended to be a complete ultrasound exam.  Patient also informed that the ultrasound is not being completed with the intent of assessing for fetal or placental anomalies or any pelvic abnormalities.  Explained that the purpose of todays ultrasound is to assess for fetal heart rate.  Patient acknowledges the purpose of the exam and the limitations of the study.  General: well-developed, well-nourished female in no acute distress  Breasts:  normal appearance, no masses or tenderness bilaterally  Skin: normal coloration and turgor, no rashes  Neurologic: oriented, normal, negative, normal mood  Extremities: normal strength, tone, and muscle mass, ROM of all joints is normal  HEENT PERRLA, extraocular movement intact and sclera clear, anicteric  Neck supple  and no masses  Cardiovascular: regular rate and rhythm  Respiratory:  no respiratory distress, normal breath sounds  Abdomen: soft, non-tender; bowel sounds normal; no masses,  no organomegaly  Pelvic: Deferred     Assessment:    Pregnancy: G1P0000 Patient Active Problem List   Diagnosis Date Noted   Encounter for supervision of normal pregnancy 07/19/2020   Obesity (BMI 30.0-34.9) 07/19/2020   Irritable bowel syndrome with diarrhea 04/12/2018     Plan:    1. Encounter for supervision of normal first pregnancy in first trimester  - Obstetric panel - HIV antibody (with  reflex) - Hepatitis C Antibody - Hemoglobinopathy Evaluation - Culture, OB Urine - GC/Chlamydia probe amp ([place])not at [facility] - [place] bedside; Future - Babyscripts Schedule Optimization - TSH - Rx Zofran - She will come back around 10/11 weeks for genetic screening (NIPS)  2. Obesity (BMI 30.0-34.9)  Start BASA at 12 weeks, RX sent    Initial labs drawn. Continue prenatal vitamins. Problem list reviewed and updated. Genetic Screening discussed, NIPS: requested. Ultrasound discussed; fetal anatomic survey: requested. Anticipatory guidance about prenatal visits given including labs, ultrasounds, and testing. Discussed usage of Babyscripts and virtual visits as additional source of managing and completing prenatal visits in midst of coronavirus and pandemic.   Encouraged to complete MyChart Registration for her ability to review results, send requests, and have questions addressed.  The nature of [place] [facility] with multiple MDs and Advanced Practice Providers was explained to patient; also emphasized that residents, students are part of our team. Routine obstetric precautions reviewed. Encouraged to seek out care at office or emergency room [facility] MAU preferred) for urgent and/or emergent concerns. Return for 1-2 weeks for genetic screening (10-[redacted] weeks gestation) 4 weeks for virtual visit .     Rogenia Werntz, Harolyn Rutherford, NP [facility] Practice [facility] [employer], [facility]

## 2020-07-20 LAB — TSH: TSH: 0.94 mIU/L

## 2020-07-22 LAB — OBSTETRIC PANEL
Absolute Monocytes: 585 cells/uL (ref 200–950)
Antibody Screen: NOT DETECTED
Basophils Absolute: 26 cells/uL (ref 0–200)
Basophils Relative: 0.3 %
Eosinophils Absolute: 112 cells/uL (ref 15–500)
Eosinophils Relative: 1.3 %
HCT: 37.6 % (ref 35.0–45.0)
Hemoglobin: 12.4 g/dL (ref 11.7–15.5)
Hepatitis B Surface Ag: NONREACTIVE
Lymphs Abs: 1256 cells/uL (ref 850–3900)
MCH: 28.1 pg (ref 27.0–33.0)
MCHC: 33 g/dL (ref 32.0–36.0)
MCV: 85.1 fL (ref 80.0–100.0)
MPV: 11.2 fL (ref 7.5–12.5)
Monocytes Relative: 6.8 %
Neutro Abs: 6622 cells/uL (ref 1500–7800)
Neutrophils Relative %: 77 %
Platelets: 208 10*3/uL (ref 140–400)
RBC: 4.42 10*6/uL (ref 3.80–5.10)
RDW: 13.6 % (ref 11.0–15.0)
RPR Ser Ql: NONREACTIVE
Rubella: 7.68 Index
Total Lymphocyte: 14.6 %
WBC: 8.6 10*3/uL (ref 3.8–10.8)

## 2020-07-22 LAB — HEMOGLOBINOPATHY EVALUATION
Fetal Hemoglobin Testing: <1 % (ref 0.0–1.9)
HCT: 39 % (ref 35.0–45.0)
Hemoglobin A2 - HGBRFX: 2.2 % (ref 2.2–3.2)
Hemoglobin: 12.3 g/dL (ref 11.7–15.5)
Hgb A: 97.8 % (ref >96.0)
MCH: 28.1 pg (ref 27.0–33.0)
MCV: 89 fL (ref 80.0–100.0)
RBC: 4.38 10*6/uL (ref 3.80–5.10)
RDW: 13.6 % (ref 11.0–15.0)

## 2020-07-22 LAB — HEPATITIS C ANTIBODY
Hepatitis C Ab: NONREACTIVE
SIGNAL TO CUT-OFF: 0.02 (ref <1.00)

## 2020-07-22 LAB — HIV ANTIBODY (ROUTINE TESTING W REFLEX): HIV 1&2 Ab, 4th Generation: NONREACTIVE

## 2020-07-23 ENCOUNTER — Encounter: Payer: BC Managed Care – PPO | Admitting: Certified Nurse Midwife

## 2020-07-23 LAB — GC/CHLAMYDIA PROBE AMP (~~LOC~~) NOT AT ARMC
Chlamydia: NEGATIVE
Comment: NEGATIVE
Comment: NORMAL
Neisseria Gonorrhea: NEGATIVE

## 2020-07-24 ENCOUNTER — Encounter: Payer: Self-pay | Admitting: Obstetrics and Gynecology

## 2020-07-24 DIAGNOSIS — R8271 Bacteriuria: Secondary | ICD-10-CM | POA: Insufficient documentation

## 2020-07-24 LAB — CULTURE, OB URINE

## 2020-07-24 LAB — URINE CULTURE, OB REFLEX

## 2020-07-29 ENCOUNTER — Encounter: Payer: Self-pay | Admitting: *Deleted

## 2020-07-29 ENCOUNTER — Encounter: Payer: BC Managed Care – PPO | Admitting: Obstetrics & Gynecology

## 2020-07-29 NOTE — Progress Notes (Signed)
Pt here for an abd OB U/S.  She had some spotting over the weekend and wanted to make sure that baby was OK.  She stated that she was very busy over the weekend and on her feet most of the day.  Bedside scan shows a viable fetus with FHT of 176 BPM and actively kicking.  Small subchorionic hemorrhage noted .  Pt is aware that she may continue to have some small amounts of spotting.  She is aware that if she should have any heavy or bright red bleeding to go to MAU or call the office.

## 2020-08-05 DIAGNOSIS — Z3481 Encounter for supervision of other normal pregnancy, first trimester: Secondary | ICD-10-CM | POA: Diagnosis not present

## 2020-08-05 DIAGNOSIS — Z31430 Encounter of female for testing for genetic disease carrier status for procreative management: Secondary | ICD-10-CM | POA: Diagnosis not present

## 2020-08-14 DIAGNOSIS — Z3401 Encounter for supervision of normal first pregnancy, first trimester: Secondary | ICD-10-CM

## 2020-08-16 ENCOUNTER — Telehealth (INDEPENDENT_AMBULATORY_CARE_PROVIDER_SITE_OTHER): Payer: BC Managed Care – PPO | Admitting: Certified Nurse Midwife

## 2020-08-16 ENCOUNTER — Encounter: Payer: Self-pay | Admitting: Certified Nurse Midwife

## 2020-08-16 VITALS — BP 110/80 | HR 80 | Wt 180.0 lb

## 2020-08-16 DIAGNOSIS — Z3A12 12 weeks gestation of pregnancy: Secondary | ICD-10-CM

## 2020-08-16 DIAGNOSIS — Z3401 Encounter for supervision of normal first pregnancy, first trimester: Secondary | ICD-10-CM

## 2020-08-16 DIAGNOSIS — R8271 Bacteriuria: Secondary | ICD-10-CM

## 2020-08-16 DIAGNOSIS — O99612 Diseases of the digestive system complicating pregnancy, second trimester: Secondary | ICD-10-CM

## 2020-08-16 DIAGNOSIS — K580 Irritable bowel syndrome with diarrhea: Secondary | ICD-10-CM

## 2020-08-16 DIAGNOSIS — O99820 Streptococcus B carrier state complicating pregnancy: Secondary | ICD-10-CM

## 2020-08-16 NOTE — Progress Notes (Signed)
   OBSTETRICS PRENATAL VIRTUAL VISIT ENCOUNTER NOTE  Provider location: [facility] [place]   I connected with Lorraine Barker on 08/16/20 at  9:50 AM EST by MyChart Video Encounter at home and verified that I am speaking with the correct person using two identifiers.   I discussed the limitations, risks, security and privacy concerns of performing an evaluation and management service virtually and the availability of in person appointments. I also discussed with the patient that there may be a patient responsible charge related to this service. The patient expressed understanding and agreed to proceed. Subjective:  Lorraine Barker is a 30 y.o. G1P0000 at [redacted]w[redacted]d being seen today for ongoing prenatal care.  She is currently monitored for the following issues for this low-risk pregnancy and has Irritable bowel syndrome with diarrhea; Encounter for supervision of normal pregnancy; Obesity (BMI 30.0-34.9); and GBS bacteriuria on their problem list.  Patient reports no complaints.  Contractions: Not present. Vag. Bleeding: None.  Movement: Absent. Denies any leaking of fluid.   The following portions of the patient's history were reviewed and updated as appropriate: allergies, current medications, past family history, past medical history, past social history, past surgical history and problem list.   Objective:   Vitals:   08/16/20 0738  BP: 110/80  Pulse: 80  Weight: 180 lb (81.6 kg)    Fetal Status:     Movement: Absent     General:  Alert, oriented and cooperative. Patient is in no acute distress.  Respiratory: Normal respiratory effort, no problems with respiration noted  Mental Status: Normal mood and affect. Normal behavior. Normal judgment and thought content.  Rest of physical exam deferred due to type of encounter  Imaging: No results found.  Assessment and Plan:  Pregnancy: G1P0000 at [redacted]w[redacted]d 1. GBS bacteriuria - treat during labor   2. Encounter for  supervision of normal first pregnancy in first trimester - Patient doing well, no complaints or concerns  - Patient reports that she tripped last week and rolled her ankle, denies abdominal trauma or any vaginal bleeding  - Discussed with patient importance of calling office with any concerns or questions, can also do doppler check due to incident, patient verbalizes understanding  - Routine prenatal care - Anticipatory guidance on upcoming appointments with next appointment in person   3. [redacted] weeks gestation of pregnancy - Educated and discussed fetal movement around 18-20 weeks and anatomy ultrasound at sister office around that time as well   Preterm labor symptoms and general obstetric precautions including but not limited to vaginal bleeding, contractions, leaking of fluid and fetal movement were reviewed in detail with the patient. I discussed the assessment and treatment plan with the patient. The patient was provided an opportunity to ask questions and all were answered. The patient agreed with the plan and demonstrated an understanding of the instructions. The patient was advised to call back or seek an in-person office evaluation/go to MAU at [facility] for any urgent or concerning symptoms. Please refer to After Visit Summary for other counseling recommendations.   I provided 7 minutes of face-to-face time during this encounter.  Return in about 4 weeks (around 09/13/2020) for LROB, in person.  Sharyon Cable, CNM Center for [employer], [facility]

## 2020-08-16 NOTE — Patient Instructions (Signed)

## 2020-08-19 ENCOUNTER — Telehealth: Payer: Self-pay

## 2020-08-19 NOTE — Telephone Encounter (Signed)
Spoke with pt and she is aware of low risk Panorama and negative Horizon screening. Pt expressed understanding.

## 2020-09-10 ENCOUNTER — Ambulatory Visit (INDEPENDENT_AMBULATORY_CARE_PROVIDER_SITE_OTHER): Payer: BC Managed Care – PPO | Admitting: Certified Nurse Midwife

## 2020-09-10 ENCOUNTER — Encounter: Payer: Self-pay | Admitting: Certified Nurse Midwife

## 2020-09-10 ENCOUNTER — Encounter: Payer: BC Managed Care – PPO | Admitting: Certified Nurse Midwife

## 2020-09-10 ENCOUNTER — Other Ambulatory Visit: Payer: Self-pay

## 2020-09-10 VITALS — BP 136/79 | HR 101 | Wt 183.0 lb

## 2020-09-10 DIAGNOSIS — R8271 Bacteriuria: Secondary | ICD-10-CM

## 2020-09-10 DIAGNOSIS — E669 Obesity, unspecified: Secondary | ICD-10-CM

## 2020-09-10 DIAGNOSIS — Z3402 Encounter for supervision of normal first pregnancy, second trimester: Secondary | ICD-10-CM

## 2020-09-10 DIAGNOSIS — Z3A16 16 weeks gestation of pregnancy: Secondary | ICD-10-CM | POA: Diagnosis not present

## 2020-09-10 NOTE — Patient Instructions (Signed)

## 2020-09-10 NOTE — Progress Notes (Signed)
   PRENATAL VISIT NOTE  Subjective:  Lorraine Barker is a 30 y.o. G1P0000 at [redacted]w[redacted]d being seen today for ongoing prenatal care.  She is currently monitored for the following issues for this low-risk pregnancy and has Irritable bowel syndrome with diarrhea; Encounter for supervision of normal pregnancy; Obesity (BMI 30.0-34.9); and GBS bacteriuria on their problem list.  Patient reports no complaints.  Contractions: Not present. Vag. Bleeding: None.  Movement: Absent. Denies leaking of fluid.   The following portions of the patient's history were reviewed and updated as appropriate: allergies, current medications, past family history, past medical history, past social history, past surgical history and problem list.   Objective:   Vitals:   09/10/20 1040 09/10/20 1513  BP: (!) 144/82 136/79  Pulse: (!) 105 (!) 101  Weight: 183 lb (83 kg)     Fetal Status: Fetal Heart Rate (bpm): 158   Movement: Absent     General:  Alert, oriented and cooperative. Patient is in no acute distress.  Skin: Skin is warm and dry. No rash noted.   Cardiovascular: Normal heart rate noted  Respiratory: Normal respiratory effort, no problems with respiration noted  Abdomen: Soft, gravid, appropriate for gestational age.  Pain/Pressure: Absent     Pelvic: Cervical exam deferred        Extremities: Normal range of motion.  Edema: None  Mental Status: Normal mood and affect. Normal behavior. Normal judgment and thought content.   Assessment and Plan:  Pregnancy: G1P0000 at [redacted]w[redacted]d 1. [redacted] weeks gestation of pregnancy - Alpha fetoprotein, maternal  2. Encounter for supervision of normal first pregnancy in second trimester - patient doing well, no complaints or concerns at this time  - routine prenatal care  - anticipatory guidance on upcoming appointments  - encouraged continue use with babyscripts, encouraged to take BP again on Friday and send to office or upload to babyscripts  - elevated BP x1 today, continue to  monitor  - [place] MFM OB COMP + 14 WK; Future  3. Obesity (BMI 30.0-34.9) - continue bASA   4. GBS bacteriuria - treat during labor   Preterm labor symptoms and general obstetric precautions including but not limited to vaginal bleeding, contractions, leaking of fluid and fetal movement were reviewed in detail with the patient. Please refer to After Visit Summary for other counseling recommendations.   Return in about 4 weeks (around 10/08/2020) for LROB, virtual.  Future Appointments  Date Time Provider Department Center  10/01/2020 10:30 AM [facility] US3 [facility]  10/08/2020  1:30 PM [name], CNM [facility]    Sharyon Cable, CNM

## 2020-09-11 LAB — ALPHA FETOPROTEIN, MATERNAL
AFP MoM: 1.36
AFP, Serum: 44.2 ng/mL
Calc'd Gestational Age: 16.9 weeks
Maternal Wt: 188 [lb_av]
Risk for ONTD: 1
Twins-AFP: 1

## 2020-10-01 ENCOUNTER — Other Ambulatory Visit: Payer: Self-pay

## 2020-10-01 ENCOUNTER — Ambulatory Visit: Payer: BC Managed Care – PPO | Attending: Certified Nurse Midwife

## 2020-10-01 DIAGNOSIS — Z3402 Encounter for supervision of normal first pregnancy, second trimester: Secondary | ICD-10-CM | POA: Diagnosis not present

## 2020-10-08 ENCOUNTER — Telehealth (INDEPENDENT_AMBULATORY_CARE_PROVIDER_SITE_OTHER): Payer: BC Managed Care – PPO

## 2020-10-08 ENCOUNTER — Other Ambulatory Visit: Payer: Self-pay

## 2020-10-08 VITALS — BP 117/75 | HR 100 | Wt 186.0 lb

## 2020-10-08 DIAGNOSIS — O99820 Streptococcus B carrier state complicating pregnancy: Secondary | ICD-10-CM

## 2020-10-08 DIAGNOSIS — Z3402 Encounter for supervision of normal first pregnancy, second trimester: Secondary | ICD-10-CM

## 2020-10-08 DIAGNOSIS — R8271 Bacteriuria: Secondary | ICD-10-CM

## 2020-10-08 DIAGNOSIS — Z3A20 20 weeks gestation of pregnancy: Secondary | ICD-10-CM

## 2020-10-08 NOTE — Progress Notes (Signed)
OBSTETRICS PRENATAL VIRTUAL VISIT ENCOUNTER NOTE  Provider location: [facility] [place]   Patient location: Home  I connected with Lorraine Barker on 10/08/20 at  1:30 PM EDT by MyChart Video Encounter and verified that I am speaking with the correct person using two identifiers. I discussed the limitations, risks, security and privacy concerns of performing an evaluation and management service virtually and the availability of in person appointments. I also discussed with the patient that there may be a patient responsible charge related to this service. The patient expressed understanding and agreed to proceed. Subjective:  Lorraine Barker is a 30 y.o. G1P0000 at [redacted]w[redacted]d being seen today for ongoing prenatal care.  She is currently monitored for the following issues for this low-risk pregnancy and has Irritable bowel syndrome with diarrhea; Encounter for supervision of normal pregnancy; Obesity (BMI 30.0-34.9); and GBS bacteriuria on their problem list.  Patient reports no complaints.  Contractions: Not present. Vag. Bleeding: None.  Movement: Present. Denies any leaking of fluid.   The following portions of the patient's history were reviewed and updated as appropriate: allergies, current medications, past family history, past medical history, past social history, past surgical history and problem list.   Objective:   Vitals:   10/08/20 1304  BP: 117/75  Pulse: 100  Weight: 186 lb (84.4 kg)    Fetal Status:     Movement: Present     General:  Alert, oriented and cooperative. Patient is in no acute distress.  Respiratory: Normal respiratory effort, no problems with respiration noted  Mental Status: Normal mood and affect. Normal behavior. Normal judgment and thought content.  Rest of physical exam deferred due to type of encounter  Imaging: [place] MFM OB COMP + 14 WK  Result Date: 10/01/2020 ----------------------------------------------------------------------   OBSTETRICS REPORT                       (Signed Final 10/01/2020 11:23 am) ---------------------------------------------------------------------- Patient Info  ID #:       [id number]                          D.O.B.:  January 25, 1991 (29 yrs)  Name:       Lorraine Barker                      Visit Date: 10/01/2020 10:24 am ---------------------------------------------------------------------- Performed By  Attending:        Lin Landsman      Ref. Address:     Faculty                    MD  Performed By:     Ceasar Lund        [facility]  Referred By:      Sharyon CableVERONICA C                    ROGERS CNM ---------------------------------------------------------------------- Orders  #  Description                           Code        Ordered By  1  US MFM OB COMP + 14 WK                76805.01    VERONICA ROGERS ----------------------------------------------------------------------  #  Order #                     Accession #                Episode #  1  [id number]                   [id number]                 [id number] ---------------------------------------------------------------------- Indications  Encounter for antenatal screening for          Z36.3  malformations  LR NIPS, Neg AFP, Neg 4/4 Horizon  [redacted] weeks gestation of pregnancy                Z3A.19 ---------------------------------------------------------------------- Fetal Evaluation  Num Of Fetuses:         1  Fetal Heart Rate(bpm):  152  Cardiac Activity:       Observed  Presentation:           Cephalic  Placenta:               Anterior  P. Cord Insertion:      Not well visualized  Amniotic Fluid  AFI FV:      Within normal limits ---------------------------------------------------------------------- Biometry  BPD:      47.2  mm      G. Age:  20w 2d         86  %    CI:         74.4   %    70 - 86                                                          FL/HC:      17.0   %    16.1 - 18.3  HC:      173.7  mm     G. Age:  19w 6d         72  %    HC/AC:      1.20        1.09 - 1.39  AC:      144.7  mm     G. Age:  19w 6d         63  %  FL/BPD:     62.5   %  FL:       29.5  mm     G. Age:  19w 1d         36  %    FL/AC:      20.4   %    20 - 24  HUM:      29.8  mm     G. Age:  19w 6d         64  %  CER:      20.9  mm     G. Age:  20w 0d         77  %  NFT:       2.5  mm  LV:          5  mm  CM:        5.8  mm  Est. FW:     298  gm    0 lb 11 oz      60  % ---------------------------------------------------------------------- OB History  Gravidity:    1 ---------------------------------------------------------------------- Gestational Age  LMP:           33w 2d        Date:  02/11/20                 EDD:   11/17/20  U/S Today:     19w 6d                                        EDD:   02/19/21  Best:          19w 2d     Det. By:  Marcella Dubs         EDD:   02/23/21                                      (07/01/20) ---------------------------------------------------------------------- Anatomy  Cranium:               Appears normal         Aortic Arch:            Appears normal  Cavum:                 Appears normal         Ductal Arch:            Appears normal  Ventricles:            Appears normal         Diaphragm:              Appears normal  Choroid Plexus:        Appears normal         Stomach:                Appears normal, left                                                                        sided  Cerebellum:  Appears normal         Abdomen:                Appears normal  Posterior Fossa:       Appears normal         Abdominal Wall:         Appears nml (cord                                                                        insert, abd wall)  Nuchal Fold:           Appears normal         Cord Vessels:           Appears  normal (3                                                                        vessel cord)  Face:                  Appears normal         Kidneys:                Appear normal                         (orbits and profile)  Lips:                  Appears normal         Bladder:                Appears normal  Thoracic:              Appears normal         Spine:                  Appears normal  Heart:                 Appears normal         Upper Extremities:      Appears normal                         (4CH, axis, and                         situs)  RVOT:                  Appears normal         Lower Extremities:      Appears normal  LVOT:                  Appears normal  Other:  Fetus appears to be a female. Heels and 5th digit visualized. Open          hands visualized. Nasal bone visualized. VC, 3VV and 3VTV  visualized. ---------------------------------------------------------------------- Cervix Uterus Adnexa  Cervix  Length:           3.43  cm.  Normal appearance by transabdominal scan.  Right Ovary  Not visualized.  Left Ovary  Within normal limits.  Adnexa  No abnormality visualized. ---------------------------------------------------------------------- Impression  Single intrauterine pregnancy here for an anatomy exam  Normal anatomy with measurements consistent with dates  There is good fetal movement and amniotic fluid volume  She has a low risk NIPS, neg horizon and AFP. ---------------------------------------------------------------------- Recommendations  Follow up growth as clinically indicated. ----------------------------------------------------------------------               Lin Landsman, MD Electronically Signed Final Report   10/01/2020 11:23 am ----------------------------------------------------------------------   Assessment and Plan:  Pregnancy: G1P0000 at [redacted]w[redacted]d 1. GBS bacteriuria   2. Encounter for supervision of normal first pregnancy in second trimester -Reviewed  negative AFP and anatomy ultrasound results -Anticipatory guidance of next visits -Encouraged patient to research pediatricians -Information for childbirth classes provided.  Preterm labor symptoms and general obstetric precautions including but not limited to vaginal bleeding, contractions, leaking of fluid and fetal movement were reviewed in detail with the patient. I discussed the assessment and treatment plan with the patient. The patient was provided an opportunity to ask questions and all were answered. The patient agreed with the plan and demonstrated an understanding of the instructions. The patient was advised to call back or seek an in-person office evaluation/go to MAU at [facility] for any urgent or concerning symptoms. Please refer to After Visit Summary for other counseling recommendations.   I provided 20 minutes of face-to-face time during this encounter.  Return in about 4 weeks (around 11/05/2020) for Return OB visit.  No future appointments.  [name], CNM [facility] [employer], [facility]

## 2020-10-08 NOTE — Patient Instructions (Addendum)
Safe Medications in Pregnancy   Acne: Benzoyl Peroxide Salicylic Acid  Backache/Headache: Tylenol: 2 regular strength every 4 hours OR              2 Extra strength every 6 hours  Colds/Coughs/Allergies: Benadryl (alcohol free) 25 mg every 6 hours as needed Breath right strips Claritin Cepacol throat lozenges Chloraseptic throat spray Cold-Eeze- up to three times per day Cough drops, alcohol free Flonase (by prescription only) Guaifenesin Mucinex Robitussin DM (plain only, alcohol free) Saline nasal spray/drops Sudafed (pseudoephedrine) & Actifed ** use only after [redacted] weeks gestation and if you do not have high blood pressure Tylenol Vicks Vaporub Zinc lozenges Zyrtec   Constipation: Colace Ducolax suppositories Fleet enema Glycerin suppositories Metamucil Milk of magnesia Miralax Senokot Smooth move tea  Diarrhea: Kaopectate Imodium A-D  *NO pepto Bismol  Hemorrhoids: Anusol Anusol HC Preparation H Tucks  Indigestion: Tums Maalox Mylanta Zantac  Pepcid  Insomnia: Benadryl (alcohol free) 25mg  every 6 hours as needed Tylenol PM Unisom, no Gelcaps  Leg Cramps: Tums MagGel  Nausea/Vomiting:  Bonine Dramamine Emetrol Ginger extract Sea bands Meclizine  Nausea medication to take during pregnancy:  Unisom (doxylamine succinate 25 mg tablets) Take one tablet daily at bedtime. If symptoms are not adequately controlled, the dose can be increased to a maximum recommended dose of two tablets daily (1/2 tablet in the morning, 1/2 tablet mid-afternoon and one at bedtime). Vitamin B6 100mg  tablets. Take one tablet twice a day (up to 200 mg per day).  Skin Rashes: Aveeno products Benadryl cream or 25mg  every 6 hours as needed Calamine Lotion 1% cortisone cream  Yeast infection: Gyne-lotrimin 7 Monistat 7   **If taking multiple medications, please check labels to avoid duplicating the same active ingredients **take medication as directed on  the label ** Do not exceed 4000 mg of tylenol in 24 hours **Do not take medications that contain aspirin or ibuprofen  [email or web address]> classes

## 2020-11-07 ENCOUNTER — Encounter: Payer: Self-pay | Admitting: Obstetrics & Gynecology

## 2020-11-07 ENCOUNTER — Telehealth (INDEPENDENT_AMBULATORY_CARE_PROVIDER_SITE_OTHER): Payer: 59 | Admitting: Obstetrics & Gynecology

## 2020-11-07 ENCOUNTER — Other Ambulatory Visit: Payer: Self-pay

## 2020-11-07 DIAGNOSIS — K581 Irritable bowel syndrome with constipation: Secondary | ICD-10-CM

## 2020-11-07 DIAGNOSIS — O99212 Obesity complicating pregnancy, second trimester: Secondary | ICD-10-CM

## 2020-11-07 DIAGNOSIS — Z3402 Encounter for supervision of normal first pregnancy, second trimester: Secondary | ICD-10-CM

## 2020-11-07 DIAGNOSIS — E669 Obesity, unspecified: Secondary | ICD-10-CM

## 2020-11-07 DIAGNOSIS — O99612 Diseases of the digestive system complicating pregnancy, second trimester: Secondary | ICD-10-CM

## 2020-11-07 DIAGNOSIS — Z3A24 24 weeks gestation of pregnancy: Secondary | ICD-10-CM

## 2020-11-07 NOTE — Patient Instructions (Signed)
Return to office for any scheduled appointments. Call the office or go to the MA[facility] at [facility] [place] if:  You begin to have strong, frequent contractions  Your water breaks.  Sometimes it is a big gush of fluid, sometimes it is just a trickle that keeps getting your panties wet or running down your legs  You have vaginal bleeding.  It is normal to have a small amount of spotting if your cervix was checked.   You do not feel your baby moving like normal.  If you do not, get something to eat and drink and lay down and focus on feeling your baby move.   If your baby is still not moving like normal, you should call the office or go to MAU.  Any other obstetric concerns.  TDaP Vaccine Pregnancy Get the Whooping Cough Vaccine While You Are Pregnant (CDC)  It is important for women to get the whooping cough vaccine in the third trimester of each pregnancy. Vaccines are the best way to prevent this disease. There are 2 different whooping cough vaccines. Both vaccines combine protection against whooping cough, tetanus and diphtheria, but they are for different age groups: Tdap: for everyone 11 years or older, including pregnant women  DTaP: for children 2 months through 6 years of age  You need the whooping cough vaccine during each of your pregnancies The recommended time to get the shot is during your 27th through 36th week of pregnancy, preferably during the earlier part of this time period. The Centers for Disease Control and Prevention (CDC) recommends that pregnant women receive the whooping cough vaccine for adolescents and adults (called Tdap vaccine) during the third trimester of each pregnancy. The recommended time to get the shot is during your 27th through 36th week of pregnancy, preferably during the earlier part of this time period. This replaces the original recommendation that pregnant women get the vaccine only if they had not previously received it. The  American College of Obstetricians and Gynecologists and the American College of Nurse-Midwives support this recommendation.  You should get the whooping cough vaccine while pregnant to pass protection to your baby frame support disabled and/or not supported in this browser  Learn why Laura decided to get the whooping cough vaccine in her 3rd trimester of pregnancy and how her baby girl was born with some protection against the disease. Also available on YouTube. After receiving the whooping cough vaccine, your body will create protective antibodies (proteins produced by the body to fight off diseases) and pass some of them to your baby before birth. These antibodies provide your baby some short-term protection against whooping cough in early life. These antibodies can also protect your baby from some of the more serious complications that come along with whooping cough. Your protective antibodies are at their highest about 2 weeks after getting the vaccine, but it takes time to pass them to your baby. So the preferred time to get the whooping cough vaccine is early in your third trimester. The amount of whooping cough antibodies in your body decreases over time. That is why CDC recommends you get a whooping cough vaccine during each pregnancy. Doing so allows each of your babies to get the greatest number of protective antibodies from you. This means each of your babies will get the best protection possible against this disease.  Getting the whooping cough vaccine while pregnant is better than getting the vaccine after you give birth Whooping cough vaccination during   pregnancy is ideal so your baby will have short-term protection as soon as he is born. This early protection is important because your baby will not start getting his whooping cough vaccines until he is 2 months old. These first few months of life are when your baby is at greatest risk for catching whooping cough. This is also when he's at  greatest risk for having severe, potentially life-threating complications from the infection. To avoid that gap in protection, it is best to get a whooping cough vaccine during pregnancy. You will then pass protection to your baby before he is born. To continue protecting your baby, he should get whooping cough vaccines starting at 2 months old. You may never have gotten the Tdap vaccine before and did not get it during this pregnancy. If so, you should make sure to get the vaccine immediately after you give birth, before leaving the hospital or birthing center. It will take about 2 weeks before your body develops protection (antibodies) in response to the vaccine. Once you have protection from the vaccine, you are less likely to give whooping cough to your newborn while caring for him. But remember, your baby will still be at risk for catching whooping cough from others. A recent study looked to see how effective Tdap was at preventing whooping cough in babies whose mothers got the vaccine while pregnant or in the hospital after giving birth. The study found that getting Tdap between 27 through 36 weeks of pregnancy is 85% more effective at preventing whooping cough in babies younger than 2 months old. Blood tests cannot tell if you need a whooping cough vaccine There are no blood tests that can tell you if you have enough antibodies in your body to protect yourself or your baby against whooping cough. Even if you have been sick with whooping cough in the past or previously received the vaccine, you still should get the vaccine during each pregnancy. Breastfeeding may pass some protective antibodies onto your baby By breastfeeding, you may pass some antibodies you have made in response to the vaccine to your baby. When you get a whooping cough vaccine during your pregnancy, you will have antibodies in your breast milk that you can share with your baby as soon as your milk comes in. However, your baby will not  get protective antibodies immediately if you wait to get the whooping cough vaccine until after delivering your baby. This is because it takes about 2 weeks for your body to create antibodies. Learn more about the health benefits of breastfeeding.  

## 2020-11-07 NOTE — Progress Notes (Signed)
   OBSTETRICS PRENATAL VIRTUAL VISIT ENCOUNTER NOTE  Provider location: [facility] [place]   Patient location: Home  I connected with Noreene Dorn on 11/07/20 at  1:00 PM EDT by MyChart Video Encounter and verified that I am speaking with the correct person using two identifiers. I discussed the limitations, risks, security and privacy concerns of performing an evaluation and management service virtually and the availability of in person appointments. I also discussed with the patient that there may be a patient responsible charge related to this service. The patient expressed understanding and agreed to proceed. Subjective:  Lorraine Barker is a 30 y.o. G1P0000 at [redacted]w[redacted]d being seen today for ongoing prenatal care.  She is currently monitored for the following issues for this low-risk pregnancy and has Irritable bowel syndrome with diarrhea; Encounter for supervision of normal pregnancy; Obesity (BMI 30.0-34.9); and GBS bacteriuria on their problem list.  Patient reports no complaints.  Contractions: Not present. Vag. Bleeding: None.  Movement: Present. Denies any leaking of fluid.   The following portions of the patient's history were reviewed and updated as appropriate: allergies, current medications, past family history, past medical history, past social history, past surgical history and problem list.   Objective:   Vitals:   11/07/20 1256  BP: 120/76  Weight: 185 lb (83.9 kg)    Fetal Status:     Movement: Present     General:  Alert, oriented and cooperative. Patient is in no acute distress.  Respiratory: Normal respiratory effort, no problems with respiration noted  Mental Status: Normal mood and affect. Normal behavior. Normal judgment and thought content.  Rest of physical exam deferred due to type of encounter   Assessment and Plan:  Pregnancy: G1P0000 at [redacted]w[redacted]d 1. Encounter for supervision of normal first pregnancy in second trimester Normal anatomy  scan. No other concerns. Second trimester expectations discussed with patient. Preterm labor symptoms and general obstetric precautions including but not limited to vaginal bleeding, contractions, leaking of fluid and fetal movement were reviewed in detail with the patient. I discussed the assessment and treatment plan with the patient. The patient was provided an opportunity to ask questions and all were answered. The patient agreed with the plan and demonstrated an understanding of the instructions. The patient was advised to call back or seek an in-person office evaluation/go to MAU at [facility] for any urgent or concerning symptoms. Please refer to After Visit Summary for other counseling recommendations.   I provided 10 minutes of face-to-face time during this encounter.  Return in about 4 weeks (around 12/05/2020) for 2 hr GTT, 3rd trimester labs, TDap, OFFICE OB VISIT (MD or APP).  No future appointments.  Jaynie Collins, MD [facility] [employer], [facility]

## 2020-11-28 ENCOUNTER — Ambulatory Visit (INDEPENDENT_AMBULATORY_CARE_PROVIDER_SITE_OTHER): Payer: 59 | Admitting: Obstetrics & Gynecology

## 2020-11-28 DIAGNOSIS — Z3A27 27 weeks gestation of pregnancy: Secondary | ICD-10-CM

## 2020-11-29 NOTE — Progress Notes (Signed)
Pt no showed return OB appt.

## 2020-12-03 ENCOUNTER — Ambulatory Visit (INDEPENDENT_AMBULATORY_CARE_PROVIDER_SITE_OTHER): Payer: 59 | Admitting: Obstetrics and Gynecology

## 2020-12-03 ENCOUNTER — Other Ambulatory Visit: Payer: Self-pay

## 2020-12-03 VITALS — BP 118/66 | HR 96 | Wt 192.0 lb

## 2020-12-03 DIAGNOSIS — Z3402 Encounter for supervision of normal first pregnancy, second trimester: Secondary | ICD-10-CM

## 2020-12-03 DIAGNOSIS — Z23 Encounter for immunization: Secondary | ICD-10-CM | POA: Diagnosis not present

## 2020-12-03 DIAGNOSIS — Z3A28 28 weeks gestation of pregnancy: Secondary | ICD-10-CM

## 2020-12-03 NOTE — Progress Notes (Signed)
   PRENATAL VISIT NOTE  Subjective:  Lorraine Barker is a 30 y.o. G1P0000 at [redacted]w[redacted]d being seen today for ongoing prenatal care.  She is currently monitored for the following issues for this low-risk pregnancy and has Irritable bowel syndrome with diarrhea; Encounter for supervision of normal pregnancy; Obesity (BMI 30.0-34.9); and GBS bacteriuria on their problem list.  Patient reports no complaints.  Contractions: Not present. Vag. Bleeding: None.  Movement: Present. Denies leaking of fluid.   The following portions of the patient's history were reviewed and updated as appropriate: allergies, current medications, past family history, past medical history, past social history, past surgical history and problem list.   Objective:   Vitals:   12/03/20 0850  BP: 118/66  Pulse: 96  Weight: 192 lb (87.1 kg)    Fetal Status: Fetal Heart Rate (bpm): 138 Fundal Height: 28 cm Movement: Present     General:  Alert, oriented and cooperative. Patient is in no acute distress.  Skin: Skin is warm and dry. No rash noted.   Cardiovascular: Normal heart rate noted  Respiratory: Normal respiratory effort, no problems with respiration noted  Abdomen: Soft, gravid, appropriate for gestational age.  Pain/Pressure: Absent     Pelvic: Cervical exam deferred        Extremities: Normal range of motion.  Edema: None  Mental Status: Normal mood and affect. Normal behavior. Normal judgment and thought content.   Assessment and Plan:  Pregnancy: G1P0000 at [redacted]w[redacted]d  1. [redacted] weeks gestation of pregnancy   2. Encounter for supervision of normal first pregnancy in second trimester  - 2Hr GTT w/ 1 Hr Carpenter 75 g - CBC - HIV antibody (with reflex) - RPR   Preterm labor symptoms and general obstetric precautions including but not limited to vaginal bleeding, contractions, leaking of fluid and fetal movement were reviewed in detail with the patient. Please refer to After Visit Summary for other counseling  recommendations.   Return in about 2 weeks (around 12/17/2020), or Please schedule with CNM for water birth consent..  Future Appointments  Date Time Provider Department Center  12/17/2020  9:10 AM Leftwich-Kirby, Wilmer Floor, CNM [facility]    Venia Carbon, NP

## 2020-12-04 LAB — 2HR GTT W 1 HR, CARPENTER, 75 G
Glucose, 1 Hr, Gest: 163 mg/dL (ref 65–179)
Glucose, 2 Hr, Gest: 136 mg/dL (ref 65–152)
Glucose, Fasting, Gest: 83 mg/dL (ref 65–91)

## 2020-12-04 LAB — CBC
HCT: 33.5 % — ABNORMAL LOW (ref 35.0–45.0)
Hemoglobin: 11.5 g/dL — ABNORMAL LOW (ref 11.7–15.5)
MCH: 31.8 pg (ref 27.0–33.0)
MCHC: 34.3 g/dL (ref 32.0–36.0)
MCV: 92.5 fL (ref 80.0–100.0)
MPV: 10.9 fL (ref 7.5–12.5)
Platelets: 164 10*3/uL (ref 140–400)
RBC: 3.62 10*6/uL — ABNORMAL LOW (ref 3.80–5.10)
RDW: 12.6 % (ref 11.0–15.0)
WBC: 8.7 10*3/uL (ref 3.8–10.8)

## 2020-12-04 LAB — HIV ANTIBODY (ROUTINE TESTING W REFLEX): HIV 1&2 Ab, 4th Generation: NONREACTIVE

## 2020-12-04 LAB — RPR: RPR Ser Ql: NONREACTIVE

## 2020-12-17 ENCOUNTER — Other Ambulatory Visit: Payer: Self-pay

## 2020-12-17 ENCOUNTER — Ambulatory Visit (INDEPENDENT_AMBULATORY_CARE_PROVIDER_SITE_OTHER): Payer: 59 | Admitting: Advanced Practice Midwife

## 2020-12-17 VITALS — BP 123/73 | HR 83 | Wt 196.0 lb

## 2020-12-17 DIAGNOSIS — Z3402 Encounter for supervision of normal first pregnancy, second trimester: Secondary | ICD-10-CM

## 2020-12-17 DIAGNOSIS — Z3A30 30 weeks gestation of pregnancy: Secondary | ICD-10-CM

## 2020-12-17 NOTE — Patient Instructions (Signed)
?  Considering Waterbirth? ?Guide for patients at [facility] ?Why consider waterbirth? ?Gentle birth for babies  ?Less pain medicine used in labor  ?May allow for passive descent/less pushing  ?May reduce perineal tears  ?More mobility and instinctive maternal position changes  ?Increased maternal relaxation  ? ?Is waterbirth safe? What are the risks of infection, drowning or other complications? ?Infection:  ?Very low risk (3.7 % for tub vs 4.8% for bed)  ?7 in 8000 waterbirths with documented infection  ?Poorly cleaned equipment most common cause  ?Slightly lower group B strep transmission rate  ?Drowning  ?Maternal:  ?Very low risk  ?Related to seizures or fainting  ?Newborn:  ?Very low risk. No evidence of increased risk of respiratory problems in multiple large studies  ?Physiological protection from breathing under water  ?Avoid underwater birth if there are any fetal complications  ?Once baby's head is out of the water, keep it out.  ?Birth complication  ?Some reports of cord trauma, but risk decreased by bringing baby to surface gradually  ?No evidence of increased risk of shoulder dystocia. Mothers can usually change positions faster in water than in a bed, possibly aiding the maneuvers to free the shoulder.  ? ?There are 2 things you MUST do to have a waterbirth with [facility]: ?Attend a waterbirth class at [facility] [place]   ?3rd Wednesday of every month from 7-9 pm (virtual during COVID) ?Free ?Register online at [email or web address] or [email or web address] or by calling [phone] ?Bring us the certificate from the class to your prenatal appointment or send via MyChart ?Meet with a midwife at 36 weeks* to see if you can still plan a waterbirth and to sign the consent.  ? ?*We also recommend that you schedule as many of your prenatal visits with a midwife as possible.   ? ?Helpful information: ?You may want to bring a bathing suit top to the hospital  to wear during labor but this is optional.  All other supplies are provided by the hospital. ?Please arrive at the hospital with signs of active labor, and do not wait at home until late in labor. It takes 45 min- 2 hours for COVID testing, fetal monitoring, and check in to your room to take place, plus transport and filling of the waterbirth tub.   ? ?Things that would prevent you from having a waterbirth: ?Unknown or Positive COVID-19 diagnosis upon admission to hospital* ?Premature, <37wks  ?Previous cesarean birth  ?Presence of thick meconium-stained fluid  ?Multiple gestation (Twins, triplets, etc.)  ?Uncontrolled diabetes or gestational diabetes requiring medication  ?Hypertension diagnosed in pregnancy or preexisting hypertension (gestational hypertension, preeclampsia, or chronic hypertension) ?Fetal growth restriction (your baby measures less than 10th percentile on ultrasound) ?Heavy vaginal bleeding  ?Non-reassuring fetal heart rate  ?Active infection (MRSA, etc.). Group B Strep is NOT a contraindication for waterbirth.  ?If your labor has to be induced and induction method requires continuous monitoring of the baby's heart rate  ?Other risks/issues identified by your obstetrical provider  ? ?Please remember that birth is unpredictable. Under certain unforeseeable circumstances your provider may advise against giving birth in the tub. These decisions will be made on a case-by-case basis and with the safety of you and your baby as our highest priority. ? ? ?*Please remember that in order to have a waterbirth, you must test Negative to COVID-19 upon admission to the hospital. ? ?Updated 10/07/20 ? ?

## 2020-12-17 NOTE — Progress Notes (Signed)
   PRENATAL VISIT NOTE  Subjective:  Lorraine Barker is a 30 y.o. G1P0000 at [redacted]w[redacted]d being seen today for ongoing prenatal care.  She is currently monitored for the following issues for this low-risk pregnancy and has Irritable bowel syndrome with diarrhea; Encounter for supervision of normal pregnancy; Obesity (BMI 30.0-34.9); and GBS bacteriuria on their problem list.  Patient reports no complaints.  Contractions: Not present. Vag. Bleeding: None.  Movement: Present. Denies leaking of fluid.   The following portions of the patient's history were reviewed and updated as appropriate: allergies, current medications, past family history, past medical history, past social history, past surgical history and problem list.   Objective:   Vitals:   12/17/20 0913  BP: 123/73  Pulse: 83  Weight: 196 lb (88.9 kg)    Fetal Status: Fetal Heart Rate (bpm): 140   Movement: Present     General:  Alert, oriented and cooperative. Patient is in no acute distress.  Skin: Skin is warm and dry. No rash noted.   Cardiovascular: Normal heart rate noted  Respiratory: Normal respiratory effort, no problems with respiration noted  Abdomen: Soft, gravid, appropriate for gestational age.  Pain/Pressure: Absent     Pelvic: Cervical exam deferred        Extremities: Normal range of motion.  Edema: Trace  Mental Status: Normal mood and affect. Normal behavior. Normal judgment and thought content.   Assessment and Plan:  Pregnancy: G1P0000 at [redacted]w[redacted]d 1. Encounter for supervision of normal first pregnancy in second trimester --Anticipatory guidance about next visits/weeks of pregnancy given. --Interested in waterbirth, questions answered. Pt to enroll in class, see midwives for most visits. Discussed waterbirth as option for low risk pregnancy. Pregnancy is hard to predict and things may arise that prevent immersion in water but labor can be supported with freedom of movement, birthing ball, shower and birth can be  supported in position of pt choosing, etc, even if waterbirth becomes unavailable. Pt states understanding.  --Pt has WB class scheduled tonight.  I recommend childbirth classes as well. --Next appt in 2 weeks with midwife  2. [redacted] weeks gestation of pregnancy   Preterm labor symptoms and general obstetric precautions including but not limited to vaginal bleeding, contractions, leaking of fluid and fetal movement were reviewed in detail with the patient. Please refer to After Visit Summary for other counseling recommendations.   Return in about 2 weeks (around 12/31/2020).  Future Appointments  Date Time Provider Department Center  12/31/2020 10:50 AM Raelyn Mora, CNM [facility]     Sharen Counter, CNM

## 2020-12-31 ENCOUNTER — Telehealth: Payer: 59 | Admitting: Nurse Practitioner

## 2021-01-07 ENCOUNTER — Encounter: Payer: Self-pay | Admitting: Advanced Practice Midwife

## 2021-01-07 ENCOUNTER — Telehealth (INDEPENDENT_AMBULATORY_CARE_PROVIDER_SITE_OTHER): Payer: 59 | Admitting: Advanced Practice Midwife

## 2021-01-07 VITALS — BP 112/78

## 2021-01-07 DIAGNOSIS — Z3402 Encounter for supervision of normal first pregnancy, second trimester: Secondary | ICD-10-CM

## 2021-01-07 DIAGNOSIS — Z20822 Contact with and (suspected) exposure to covid-19: Secondary | ICD-10-CM

## 2021-01-07 DIAGNOSIS — Z3A33 33 weeks gestation of pregnancy: Secondary | ICD-10-CM

## 2021-01-07 NOTE — Progress Notes (Signed)
   OBSTETRICS PRENATAL VIRTUAL VISIT ENCOUNTER NOTE  Provider location: [facility] [place]   Patient location: Home  I connected with Lorraine Barker on 01/07/21 at  9:50 AM EDT by MyChart Video Encounter and verified that I am speaking with the correct person using two identifiers. I discussed the limitations, risks, security and privacy concerns of performing an evaluation and management service virtually and the availability of in person appointments. I also discussed with the patient that there may be a patient responsible charge related to this service. The patient expressed understanding and agreed to proceed. Subjective:  Lorraine Barker is a 30 y.o. G1P0000 at [redacted]w[redacted]d being seen today for ongoing prenatal care.  She is currently monitored for the following issues for this low-risk pregnancy and has Irritable bowel syndrome with diarrhea; Encounter for supervision of normal pregnancy; Obesity (BMI 30.0-34.9); and GBS bacteriuria on their problem list.  Patient reports no complaints.  Contractions: Not present. Vag. Bleeding: None.  Movement: Present. Denies any leaking of fluid.   The following portions of the patient's history were reviewed and updated as appropriate: allergies, current medications, past family history, past medical history, past social history, past surgical history and problem list.   Objective:   Vitals:   01/07/21 0933  BP: 112/78    Fetal Status:     Movement: Present     General:  Alert, oriented and cooperative. Patient is in no acute distress.  Respiratory: Normal respiratory effort, no problems with respiration noted  Mental Status: Normal mood and affect. Normal behavior. Normal judgment and thought content.  Rest of physical exam deferred due to type of encounter  Imaging: No results found.  Assessment and Plan:  Pregnancy: G1P0000 at [redacted]w[redacted]d 1. Encounter for supervision of normal first pregnancy in second trimester --Pt reports good  fetal movement, denies cramping, LOF, or vaginal bleeding  --Anticipatory guidance about next visits/weeks of pregnancy given. --Next visit in 2 weeks  2. [redacted] weeks gestation of pregnancy   3. Close exposure to COVID-19 virus --Pt was with her parents over the weekend and her father tested positive today with mild symptoms. She denies any symptoms. --Symptom watch and test if symptoms develop   Preterm labor symptoms and general obstetric precautions including but not limited to vaginal bleeding, contractions, leaking of fluid and fetal movement were reviewed in detail with the patient. I discussed the assessment and treatment plan with the patient. The patient was provided an opportunity to ask questions and all were answered. The patient agreed with the plan and demonstrated an understanding of the instructions. The patient was advised to call back or seek an in-person office evaluation/go to [facility] at [facility] for any urgent or concerning symptoms. Please refer to After Visit Summary for other counseling recommendations.   I provided 7 minutes of face-to-face time during this encounter.  Return in about 2 weeks (around 01/21/2021).  Future Appointments  Date Time Provider Department Center  01/21/2021  9:50 AM Leftwich-Kirby, Wilmer Floor, CNM [facility]    Sharen Counter, CNM [facility] [employer]

## 2021-01-18 IMAGING — US US PELVIS COMPLETE WITH TRANSVAGINAL
1 series · 13 of 25 positions shown · non-contrast
Comparison: CT renal colic 04/19/2017 pelvic ultrasound 07/25/2015.

CLINICAL DATA: Lower abdominal and pelvic pain for 3 days, history
of ovarian cyst in 2865



[Series 1: us pelvis complete with transvaginal · 0.22mm/px · 123 acquisitions, 13 frames shown]
[im 1/123]
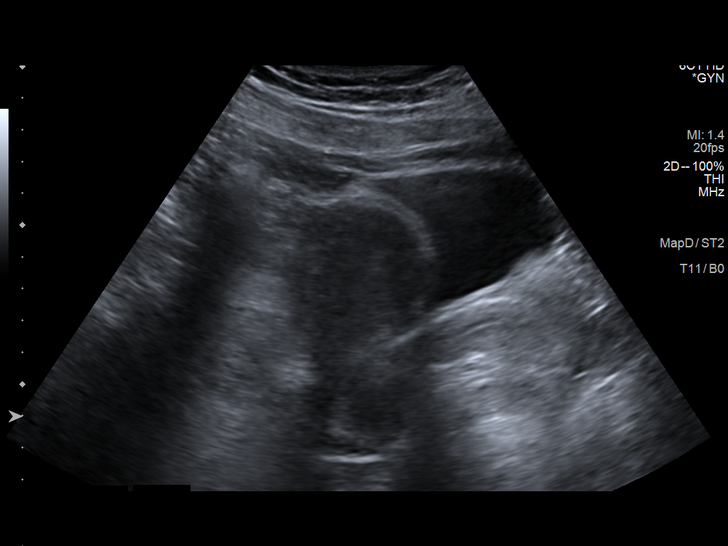
[im 11/123]
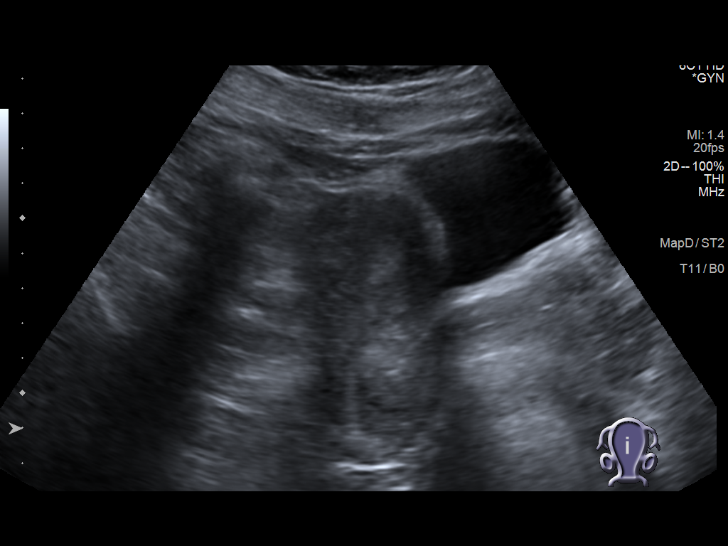
[im 21/123]
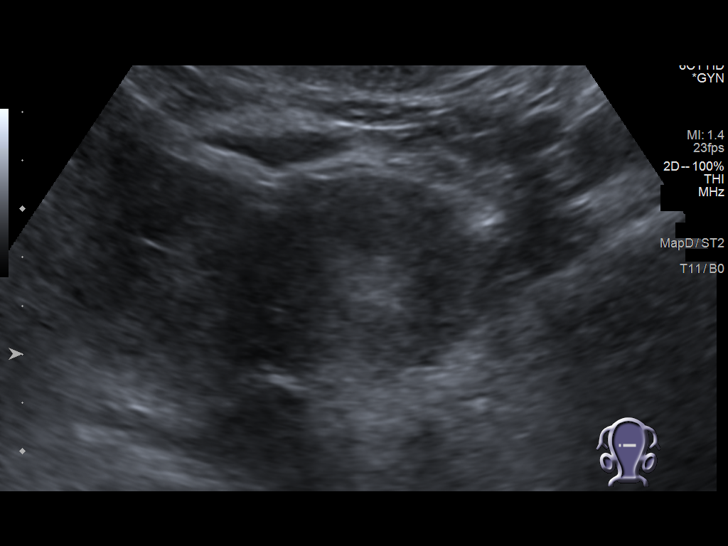
[im 31/123]
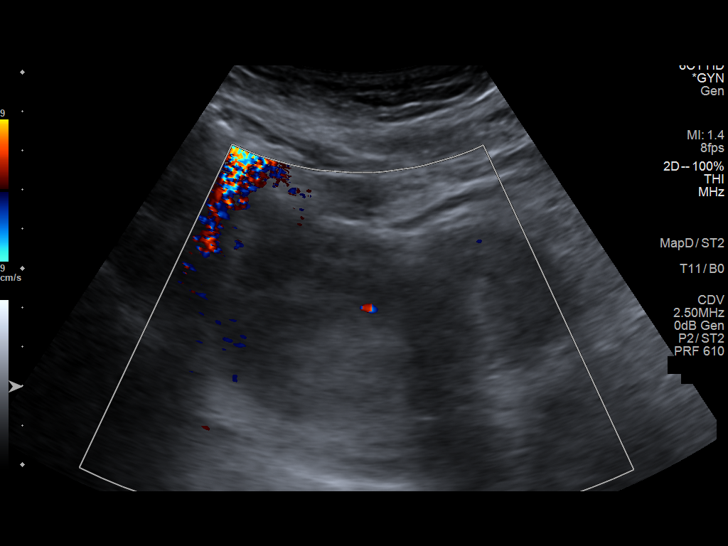
[im 41/123]
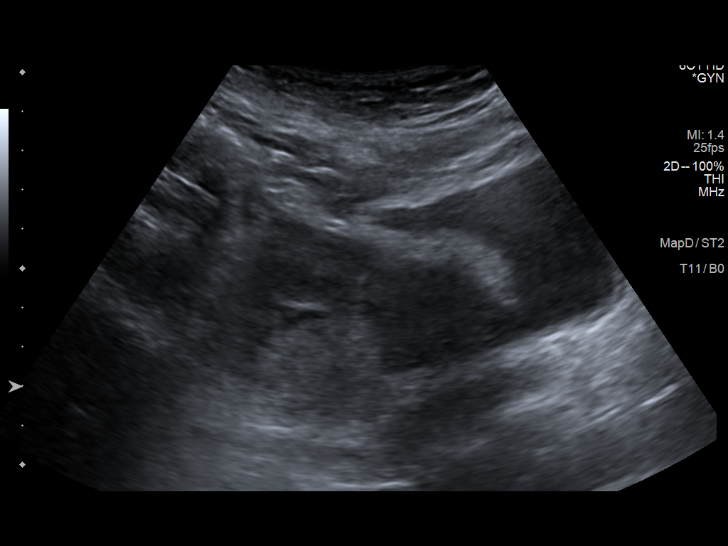
[im 51/123]
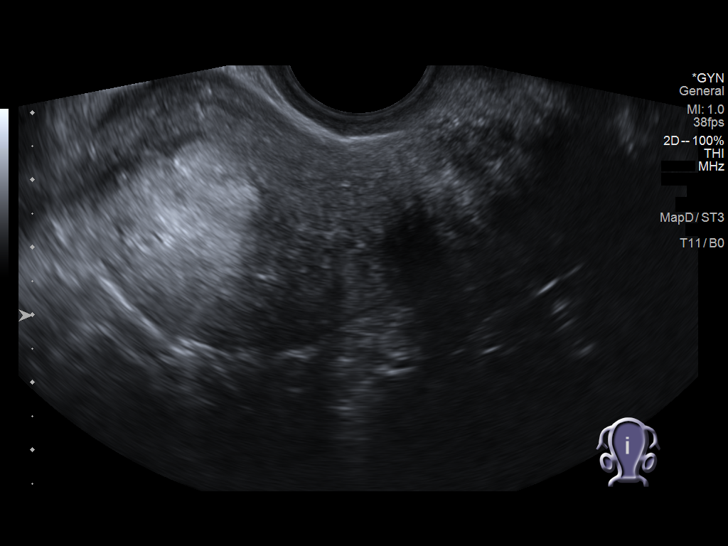
[im 62/123]
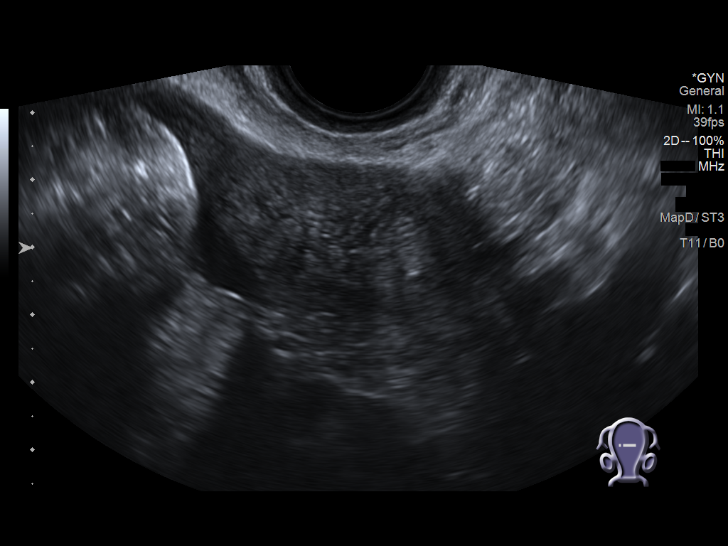
[im 72/123]
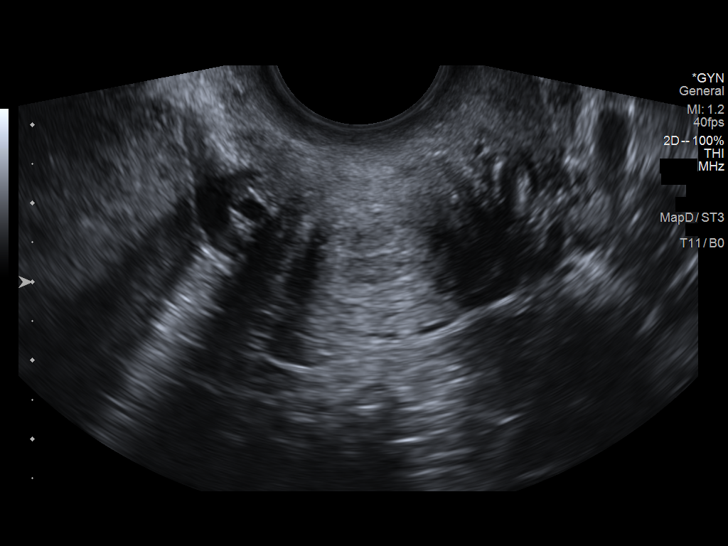
[im 82/123]
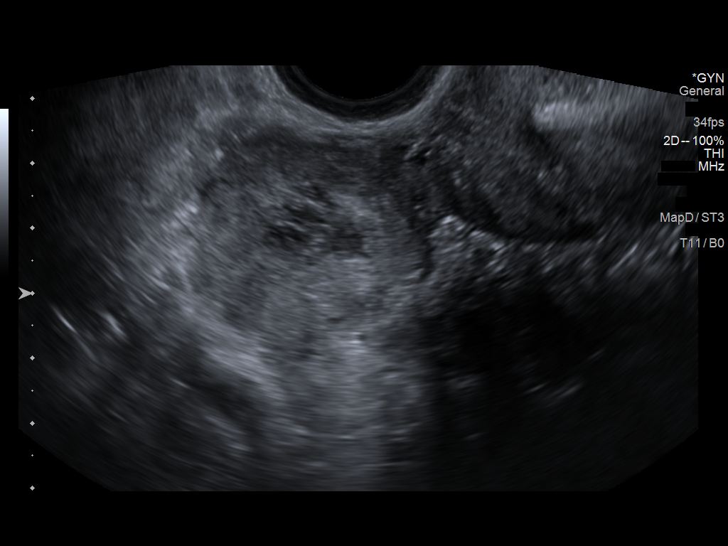
[im 92/123]
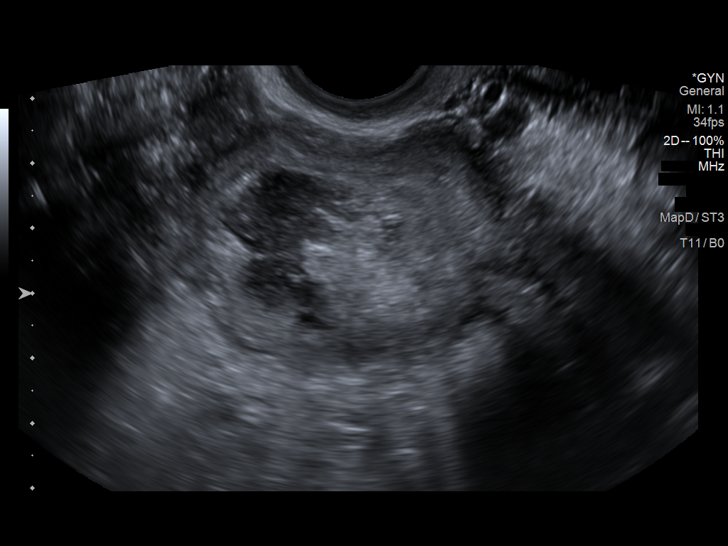
[im 102/123]
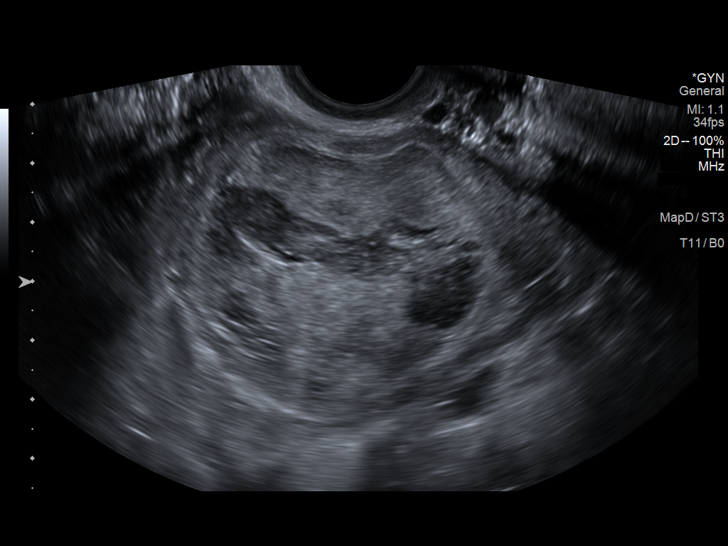
[im 112/123]
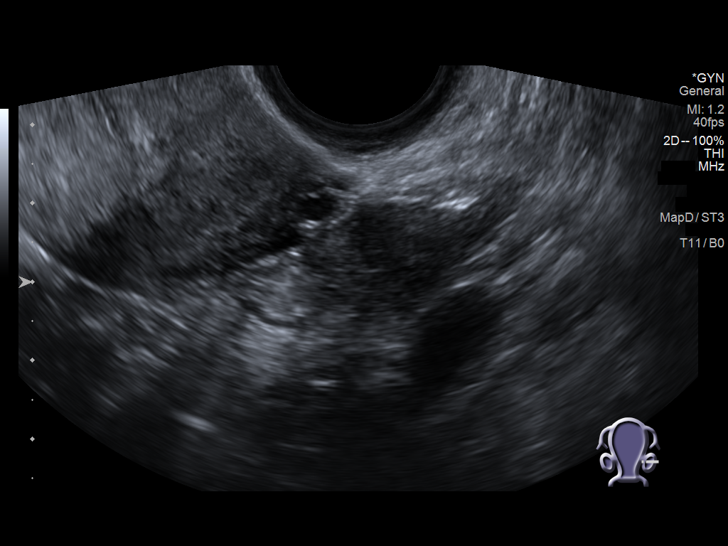
[im 123/123]
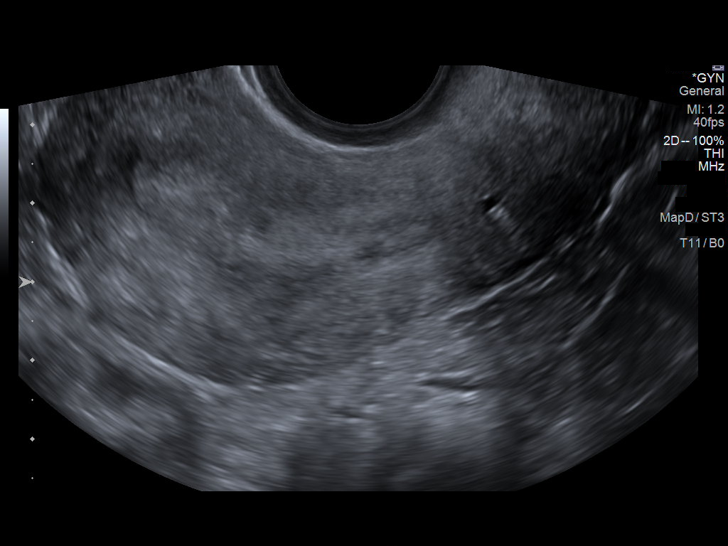

[13 of 25 positions shown; findings below may reference images not displayed]

FINDINGS: Uterus

Measurements: 7.8 x 3.8 x 4.7 cm = volume: 73 mL. Anteverted uterus.
Mild heterogeneity thickening of the myometrium with punctate
echogenic foci and posterior shadowing in a Venetian blind type
pattern which could suggest the presence of adenomyosis

Endometrium

Thickness: 7.7 mm, non thickened.  No focal abnormality visualized.

Right ovary

Measurements: 6.6 x 4.9 x 5.1 cm = volume: 8.6 mL. 5.5 x 4.5 x
cm complex possibly solid and cystic structure arising in the right
adnexa with thick nodular peripheral echogenic components and more
central hypoechoic features. There is a peripheral color flow which
is likely within the ovarian parenchyma but with absent internal
color flow.

Left ovary

Measurements: 3.5 x 1.6 x 1.9 cm = volume: 6 mL. Normal
appearance/no adnexal mass.

Other findings

No abnormal free fluid.
IMPRESSION: Heterogeneous, thick-walled solid/cystic avascular structure in the
right adnexa measuring up to 5.5 cm in size with peripheral color
flow, has an appearance which may reflect a corpus
hemorrhagicum/hemorrhagic corpus luteum cyst however the size of
this focus is somewhat greater than typically expected. Could
consider short-term interval follow-up to assess for progressive
involution (6-12 weeks) though given patient's symptomatology,
gynecologic consultation could be considered as well.

Mild heterogeneity in the myometrium of the uterus with some
punctate echogenic and shadowing foci have an appearance suggestive
of adenomyosis.

## 2021-01-21 ENCOUNTER — Other Ambulatory Visit: Payer: Self-pay

## 2021-01-21 ENCOUNTER — Ambulatory Visit (INDEPENDENT_AMBULATORY_CARE_PROVIDER_SITE_OTHER): Payer: 59 | Admitting: Obstetrics and Gynecology

## 2021-01-21 VITALS — BP 123/71 | HR 86 | Wt 199.0 lb

## 2021-01-21 DIAGNOSIS — Z3402 Encounter for supervision of normal first pregnancy, second trimester: Secondary | ICD-10-CM

## 2021-01-21 NOTE — Progress Notes (Signed)
   PRENATAL VISIT NOTE  Subjective:  Lorraine Barker is a 30 y.o. G1P0000 at [redacted]w[redacted]d being seen today for ongoing prenatal care.  She is currently monitored for the following issues for this low-risk pregnancy and has Irritable bowel syndrome with diarrhea; Encounter for supervision of normal pregnancy; Obesity (BMI 30.0-34.9); and GBS bacteriuria on their problem list.  Patient reports swelling in ankles, and hands. No headaches or changes in vision.   Contractions: Not present. Vag. Bleeding: None.  Movement: Present. Denies leaking of fluid.   The following portions of the patient's history were reviewed and updated as appropriate: allergies, current medications, past family history, past medical history, past social history, past surgical history and problem list.   Objective:   Vitals:   01/21/21 0915  BP: 123/71  Pulse: 86  Weight: 199 lb (90.3 kg)    Fetal Status: Fetal Heart Rate (bpm): 138 Fundal Height: 36 cm Movement: Present     General:  Alert, oriented and cooperative. Patient is in no acute distress.  Skin: Skin is warm and dry. No rash noted.   Cardiovascular: Normal heart rate noted  Respiratory: Normal respiratory effort, no problems with respiration noted  Abdomen: Soft, gravid, appropriate for gestational age.  Pain/Pressure: Absent     Pelvic: Cervical exam deferred        Extremities: Normal range of motion.  Edema: Trace  Mental Status: Normal mood and affect. Normal behavior. Normal judgment and thought content.   Assessment and Plan:  Pregnancy: G1P0000 at [redacted]w[redacted]d  1. Encounter for supervision of normal first pregnancy in second trimester  Increase oral fluid intake GBS positive GC next visit  BP good today She complete WB class.    Preterm labor symptoms and general obstetric precautions including but not limited to vaginal bleeding, contractions, leaking of fluid and fetal movement were reviewed in detail with the patient. Please refer to After Visit  Summary for other counseling recommendations.   Return in about 1 week (around 01/28/2021).  No future appointments.  Venia Carbon, NP

## 2021-01-31 ENCOUNTER — Other Ambulatory Visit (HOSPITAL_COMMUNITY)
Admission: RE | Admit: 2021-01-31 | Discharge: 2021-01-31 | Disposition: A | Discharge status: Home / Self Care | Payer: 59 | Source: Ambulatory Visit

## 2021-01-31 ENCOUNTER — Other Ambulatory Visit: Payer: Self-pay

## 2021-01-31 ENCOUNTER — Ambulatory Visit (INDEPENDENT_AMBULATORY_CARE_PROVIDER_SITE_OTHER): Payer: 59

## 2021-01-31 VITALS — BP 123/75 | HR 87 | Wt 201.0 lb

## 2021-01-31 DIAGNOSIS — Z3402 Encounter for supervision of normal first pregnancy, second trimester: Secondary | ICD-10-CM | POA: Diagnosis present

## 2021-01-31 DIAGNOSIS — B379 Candidiasis, unspecified: Secondary | ICD-10-CM

## 2021-01-31 DIAGNOSIS — Z3A36 36 weeks gestation of pregnancy: Secondary | ICD-10-CM

## 2021-01-31 DIAGNOSIS — R8271 Bacteriuria: Secondary | ICD-10-CM

## 2021-01-31 NOTE — Progress Notes (Signed)
Pt has noticed increase swelling in feet and ankles

## 2021-01-31 NOTE — Progress Notes (Signed)
   LOW-RISK PREGNANCY OFFICE VISIT  Patient name: Lorraine Barker MRN [id number]  Date of birth: 01-27-1991 Chief Complaint:   Routine Prenatal Visit  Subjective:   Lorraine Barker is a 30 y.o. G22P0000 female at [redacted]w[redacted]d with an Estimated Date of Delivery: 02/23/21 being seen today for ongoing management of a low-risk pregnancy aeb has Irritable bowel syndrome with diarrhea; Encounter for supervision of normal pregnancy; Obesity (BMI 30.0-34.9); and GBS bacteriuria on their problem list.  Patient presents today with no complaints.  Patient endorses fetal movement. Patient denies abdominal cramping or contractions.  Patient denies vaginal concerns including abnormal discharge, leaking of fluid, and bleeding.  Contractions: Not present. Vag. Bleeding: None.  Movement: Present.  Reviewed past medical,surgical, social, obstetrical and family history as well as problem list, medications and allergies.  Objective   Vitals:   01/31/21 1007  BP: 123/75  Pulse: 87  Weight: 201 lb (91.2 kg)  Body mass index is 34.5 kg/m.  Total Weight Gain:13 lb (5.897 kg)         Physical Examination:   General appearance: Well appearing, and in no distress  Mental status: Alert, oriented to person, place, and time  Skin: Warm & dry  Cardiovascular: Normal heart rate noted  Respiratory: Normal respiratory effort, no distress  Abdomen: Soft, gravid, nontender, AGA with Fundal Height: 37 cm  Pelvic: Cervical exam deferred           Extremities: Edema: Mild pitting, slight indentation  Fetal Status: Fetal Heart Rate (bpm): 129  Movement: Present   No results found for this or any previous visit (from the past 24 hour(s)).  Assessment & Plan:  Low-risk pregnancy of a 30 y.o., G1P0000 at [redacted]w[redacted]d with an Estimated Date of Delivery: 02/23/21   1. Encounter for supervision of normal first pregnancy in second trimester -Anticipatory guidance for upcoming appts. -Patient to schedule next appt in 1 weeks for an in-person  visit. -Reassured that some edema of feet and ankles is normal. -Encouraged increased hydration and elevation.   2. GBS bacteriuria -No GBS culture today. -Will treat with PCN while in labor.   3. [redacted] weeks gestation of pregnancy -Doing well. -Reports she just sold her house and will be building! -Hoping baby will arrive after she moves on the 17th.     Meds: No orders of the defined types were placed in this encounter.  Labs/procedures today:  Lab Orders  No laboratory test(s) ordered today     Reviewed: Term labor symptoms and general obstetric precautions including but not limited to vaginal bleeding, contractions, leaking of fluid and fetal movement were reviewed in detail with the patient.  All questions were answered.  Follow-up: No follow-ups on file.  No orders of the defined types were placed in this encounter.  Cherre Robins MSN, CNM 01/31/2021

## 2021-02-02 LAB — CERVICOVAGINAL ANCILLARY ONLY
Bacterial Vaginitis (gardnerella): NEGATIVE
Candida Glabrata: NEGATIVE
Candida Vaginitis: POSITIVE — AB
Chlamydia: NEGATIVE
Comment: NEGATIVE
Comment: NEGATIVE
Comment: NEGATIVE
Comment: NEGATIVE
Comment: NORMAL
Neisseria Gonorrhea: NEGATIVE

## 2021-02-04 IMAGING — US US PELVIS COMPLETE TRANSABD/TRANSVAG W DUPLEX
2 series · 13 of 25 positions shown · non-contrast
Comparison: Pelvic ultrasound 02/02/2020

CLINICAL DATA: Pelvic pain, large ovarian cyst, LMP 01/26/2020

EXAM:
TRANSABDOMINAL AND TRANSVAGINAL ULTRASOUND OF PELVIS
DOPPLER ULTRASOUND OF OVARIES
TECHNIQUE: Both transabdominal and transvaginal ultrasound examinations of the
pelvis were performed. Transabdominal technique was performed for
global imaging of the pelvis including uterus, ovaries, adnexal
regions, and pelvic cul-de-sac.
It was necessary to proceed with endovaginal exam following the
transabdominal exam to visualize the uterus, endometrium, and
ovaries. Color and duplex Doppler ultrasound was utilized to
evaluate blood flow to the ovaries.

[Series 1: us pelvis complete transabd/transvag w duplex · 0.21mm/px · 1 of 1 slices shown (1 of 2)]
[im 1/1]
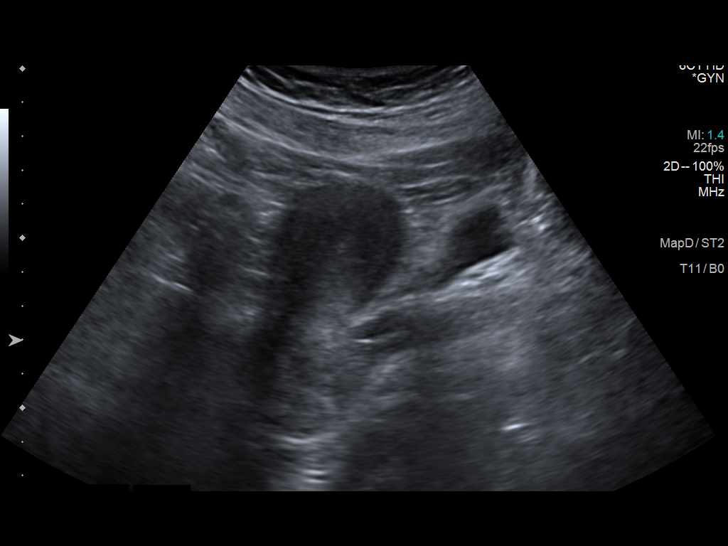

[Series 2: us pelvis complete transabd/transvag w duplex · 0.21mm/px · 155 acquisitions, 12 frames shown (2 of 2)]
[im 7/155]
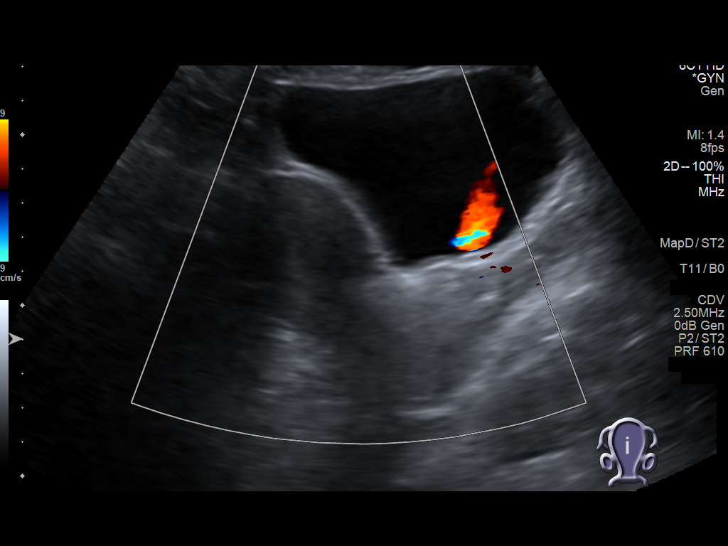
[im 21/155]
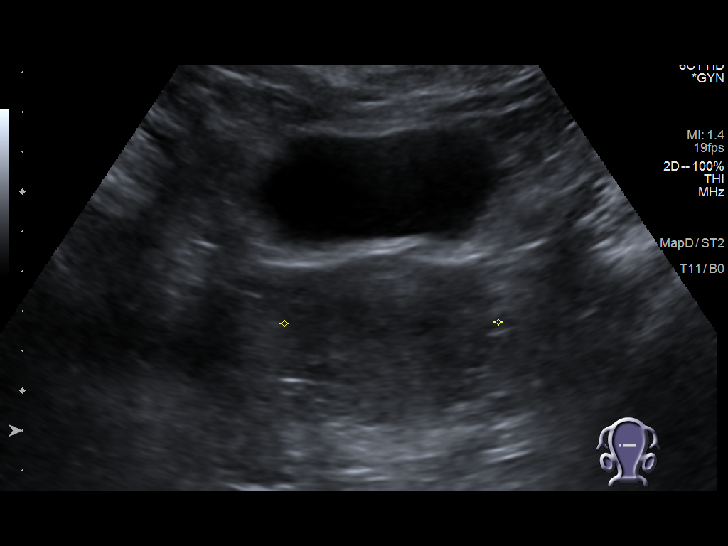
[im 34/155]
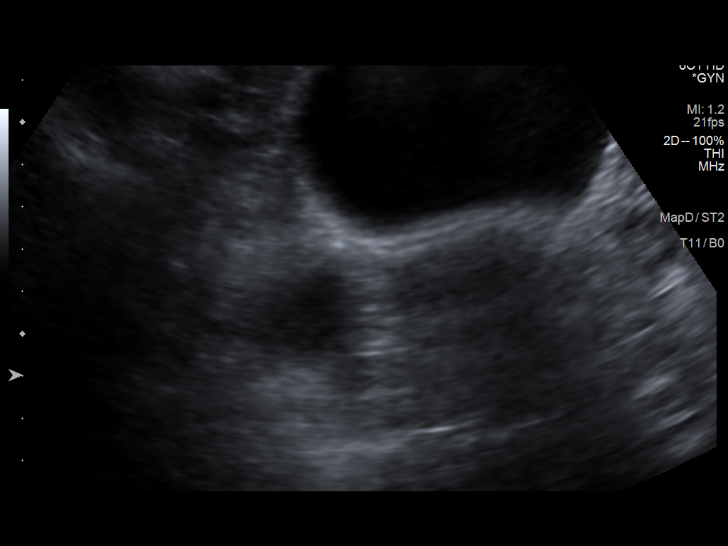
[im 47/155]
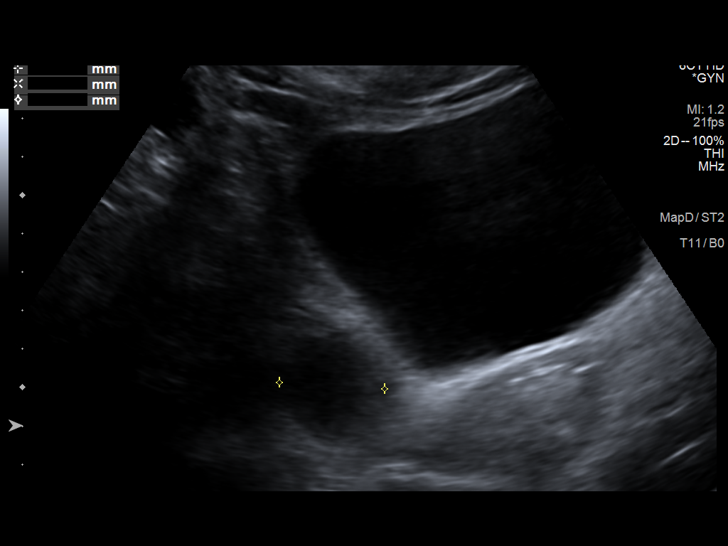
[im 61/155]
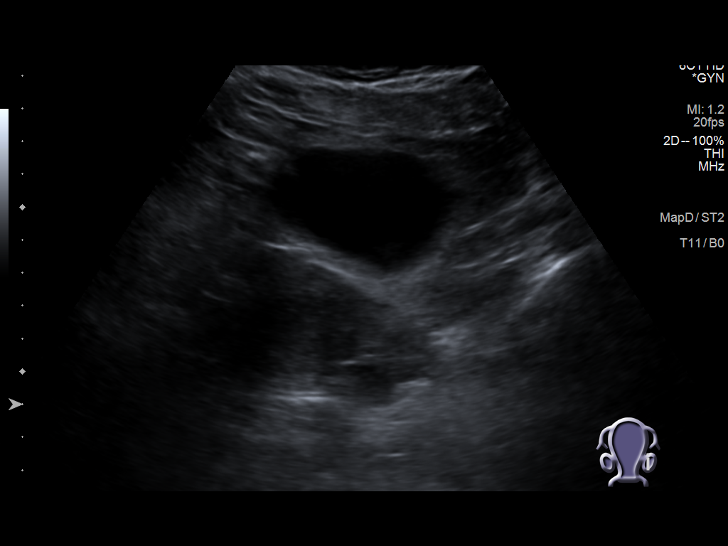
[im 74/155]
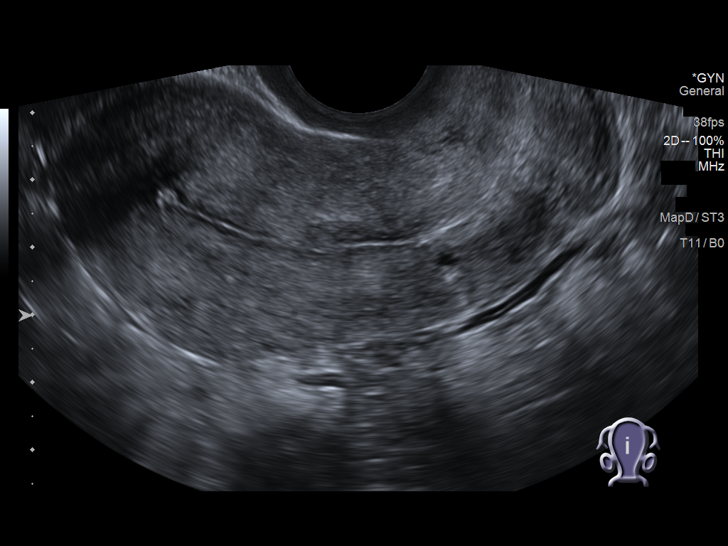
[im 88/155]
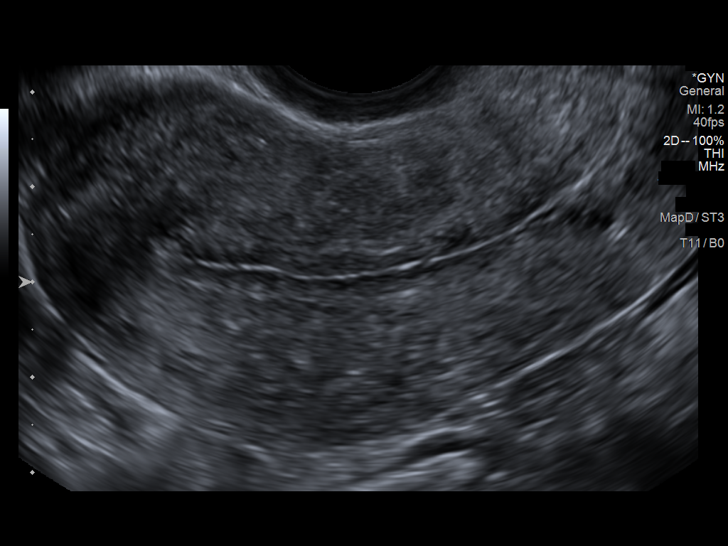
[im 101/155]
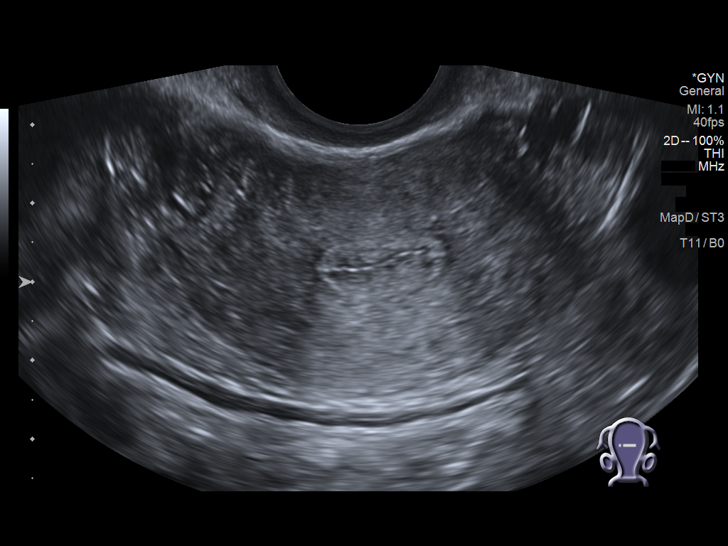
[im 114/155]
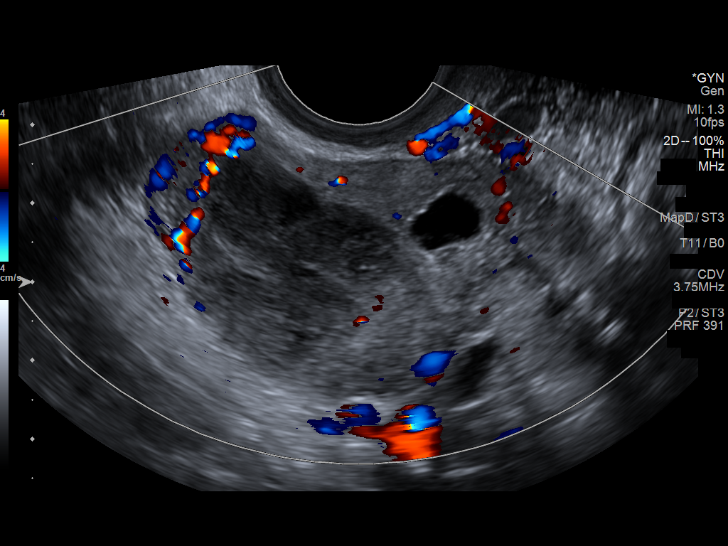
[im 128/155]
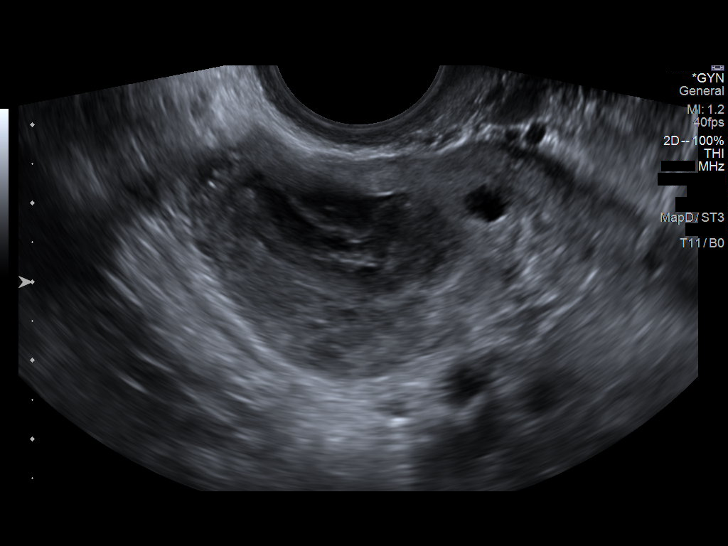
[im 141/155]
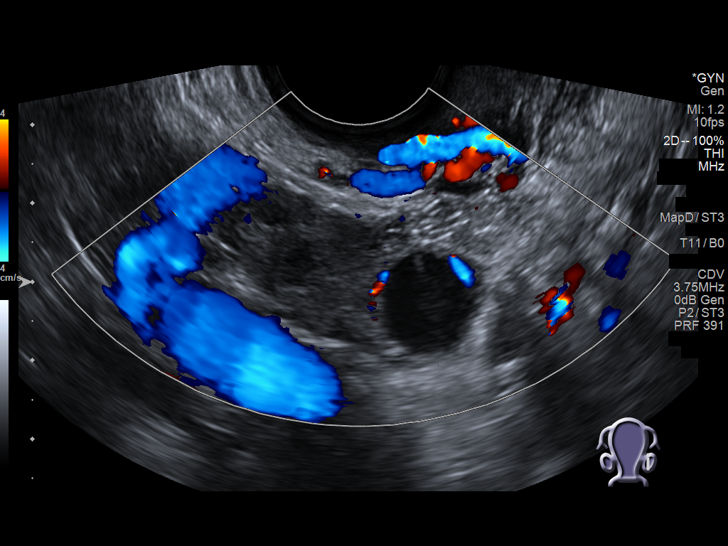
[im 155/155]
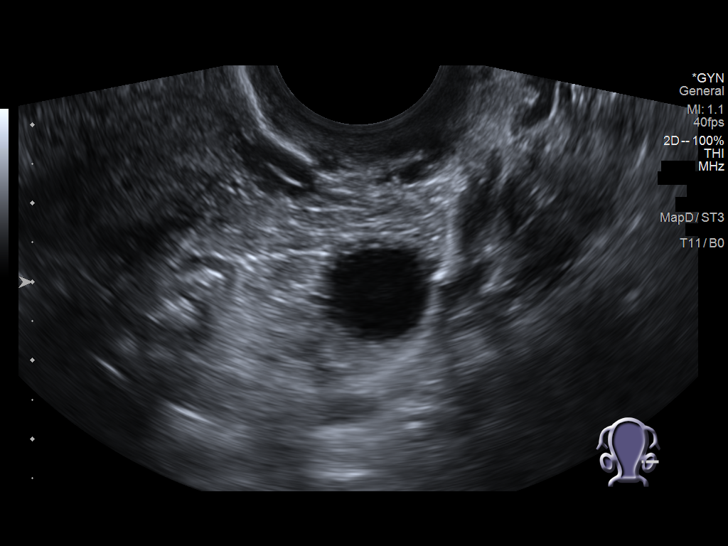

[13 of 25 positions shown; findings below may reference images not displayed]

FINDINGS: Uterus

Measurements: 8.4 x 3.7 x 5.7 cm = volume: 92 mL. Anteverted. Mildly
heterogeneous myometrium without focal mass

Endometrium

Thickness: 6 mm.  No endometrial fluid or focal abnormality

Right ovary

Measurements: 4.7 x 2.9 x 3.5 cm = volume: 25 mL. Complicated
hypoechoic nodule within RIGHT ovary 3.3 x 2.3 x 2.8 cm, containing
heterogeneous internal echogenicity, minimal complex fluid and few
thin septations. Appearance favors a hemorrhagic cyst. Lesion has
decreased in size since the previous exam and appears less
echogenic. Blood flow present within RIGHT ovary on color Doppler
imaging.

Left ovary

Measurements: 3.6 x 1.8 x 1.9 cm = volume: 6 mL. Dominant
physiologic follicle without additional mass

Pulsed Doppler evaluation of both ovaries demonstrates low
resistance arterial and venous waveforms within both ovaries.

Other findings

No free pelvic fluid or adnexal masses.
IMPRESSION: Interval significant decrease in size of complicated lesion within
the RIGHT ovary mild 3.3 cm greatest diameter previously 5.5 cm,
likely an evolving hemorrhagic cyst; the decrease in size favors a
non neoplastic cyst and no follow-up imaging is recommended.
Reference: Radiology [DATE]):[id number].

No new pelvic sonographic abnormalities identified.

## 2021-02-06 MED ORDER — TERCONAZOLE 0.8 % VA CREA: 1.0000 | TOPICAL_CREAM | Freq: Every day | VAGINAL | 0 refills | Status: DC

## 2021-02-06 NOTE — Addendum Note (Signed)
Addended by: Gerrit Heck L on: 02/06/2021 10:57 AM   Modules accepted: Orders

## 2021-02-07 ENCOUNTER — Other Ambulatory Visit: Payer: Self-pay

## 2021-02-07 ENCOUNTER — Ambulatory Visit (INDEPENDENT_AMBULATORY_CARE_PROVIDER_SITE_OTHER): Payer: 59 | Admitting: Certified Nurse Midwife

## 2021-02-07 VITALS — BP 130/73 | HR 78 | Wt 200.0 lb

## 2021-02-07 DIAGNOSIS — Z3A37 37 weeks gestation of pregnancy: Secondary | ICD-10-CM

## 2021-02-07 DIAGNOSIS — Z3403 Encounter for supervision of normal first pregnancy, third trimester: Secondary | ICD-10-CM

## 2021-02-07 NOTE — Progress Notes (Signed)
Subjective:  Lorraine Barker is a 30 y.o. G1P0000 at [redacted]w[redacted]d being seen today for ongoing prenatal care.  She is currently monitored for the following issues for this low-risk pregnancy and has Irritable bowel syndrome with diarrhea; Encounter for supervision of normal pregnancy; Obesity (BMI 30.0-34.9); and GBS bacteriuria on their problem list.  Patient reports no complaints.  Contractions: Not present. Vag. Bleeding: None.  Movement: Present. Denies leaking of fluid.   The following portions of the patient's history were reviewed and updated as appropriate: allergies, current medications, past family history, past medical history, past social history, past surgical history and problem list. Problem list updated.  Objective:   Vitals:   02/07/21 1052  BP: 130/73  Pulse: 78  Weight: 200 lb (90.7 kg)    Fetal Status: Fetal Heart Rate (bpm): 130 Fundal Height: 37 cm Movement: Present  Presentation: Vertex  General:  Alert, oriented and cooperative. Patient is in no acute distress.  Skin: Skin is warm and dry. No rash noted.   Cardiovascular: Normal heart rate noted  Respiratory: Normal respiratory effort, no problems with respiration noted  Abdomen: Soft, gravid, appropriate for gestational age. Pain/Pressure: Present     Pelvic: Vag. Bleeding: None Vag D/C Character: Thin   Cervical exam deferred        Extremities: Normal range of motion.  Edema: Mild pitting, slight indentation  Mental Status: Normal mood and affect. Normal behavior. Normal judgment and thought content.   Urinalysis:      Assessment and Plan:  Pregnancy: G1P0000 at [redacted]w[redacted]d  1. Encounter for supervision of normal first pregnancy in third trimester   2. [redacted] weeks gestation of pregnancy   Term labor symptoms and general obstetric precautions including but not limited to vaginal bleeding, contractions, leaking of fluid and fetal movement were reviewed in detail with the patient. Please refer to After Visit Summary for  other counseling recommendations.  Return in about 1 week (around 02/14/2021).   Donette Larry, CNM

## 2021-02-14 ENCOUNTER — Ambulatory Visit (INDEPENDENT_AMBULATORY_CARE_PROVIDER_SITE_OTHER): Payer: 59 | Admitting: Obstetrics and Gynecology

## 2021-02-14 ENCOUNTER — Other Ambulatory Visit: Payer: Self-pay

## 2021-02-14 DIAGNOSIS — Z3A38 38 weeks gestation of pregnancy: Secondary | ICD-10-CM

## 2021-02-14 DIAGNOSIS — Z3403 Encounter for supervision of normal first pregnancy, third trimester: Secondary | ICD-10-CM

## 2021-02-14 NOTE — Patient Instructions (Signed)

## 2021-02-14 NOTE — Progress Notes (Signed)
   PRENATAL VISIT NOTE  Subjective:  Nomi Rudnicki is a 30 y.o. G1P0000 at [redacted]w[redacted]d being seen today for ongoing prenatal care.  She is currently monitored for the following issues for this low-risk pregnancy and has Irritable bowel syndrome with diarrhea; Encounter for supervision of normal pregnancy; Obesity (BMI 30.0-34.9); and GBS bacteriuria on their problem list.  Patient reports no complaints.  Contractions: Not present. Vag. Bleeding: None.  Movement: Present. Denies leaking of fluid.   The following portions of the patient's history were reviewed and updated as appropriate: allergies, current medications, past family history, past medical history, past social history, past surgical history and problem list.   Objective:   Vitals:   02/14/21 0856  BP: 127/78  Pulse: 89  Weight: 202 lb (91.6 kg)    Fetal Status: Fetal Heart Rate (bpm): 138   Movement: Present     General:  Alert, oriented and cooperative. Patient is in no acute distress.  Skin: Skin is warm and dry. No rash noted.   Cardiovascular: Normal heart rate noted  Respiratory: Normal respiratory effort, no problems with respiration noted  Abdomen: Soft, gravid, appropriate for gestational age.  Pain/Pressure: Present     Pelvic: Cervical exam deferred        Extremities: Normal range of motion.  Edema: Mild pitting, slight indentation  Mental Status: Normal mood and affect. Normal behavior. Normal judgment and thought content.   Assessment and Plan:  Pregnancy: G1P0000 at [redacted]w[redacted]d  1. Encounter for supervision of normal first pregnancy in third trimester  [place] confirmed vertex position today Swelling in bilateral ankles,  no pain. Keep an eye on BP this week, drink plenty of fluid.   Term labor symptoms and general obstetric precautions including but not limited to vaginal bleeding, contractions, leaking of fluid and fetal movement were reviewed in detail with the patient. Please refer to After Visit Summary for other  counseling recommendations.   No follow-ups on file.  Future Appointments  Date Time Provider Department Center  02/19/2021 10:30 AM Jennings Corado, Harolyn Rutherford, NP [facility]  02/28/2021 10:30 AM Sherwin Hollingshed, Harolyn Rutherford, NP [facility]    Venia Carbon, NP

## 2021-02-16 ENCOUNTER — Encounter (HOSPITAL_COMMUNITY): Payer: Self-pay | Admitting: Obstetrics and Gynecology

## 2021-02-16 ENCOUNTER — Inpatient Hospital Stay (HOSPITAL_COMMUNITY)
Admission: AD | Admit: 2021-02-16 | Discharge: 2021-02-18 | DRG: 807 | Disposition: A | Discharge status: Home / Self Care | Payer: 59 | Attending: Family Medicine | Admitting: Family Medicine

## 2021-02-16 ENCOUNTER — Other Ambulatory Visit: Payer: Self-pay

## 2021-02-16 DIAGNOSIS — O99820 Streptococcus B carrier state complicating pregnancy: Secondary | ICD-10-CM | POA: Diagnosis not present

## 2021-02-16 DIAGNOSIS — Z3490 Encounter for supervision of normal pregnancy, unspecified, unspecified trimester: Secondary | ICD-10-CM

## 2021-02-16 DIAGNOSIS — O4292 Full-term premature rupture of membranes, unspecified as to length of time between rupture and onset of labor: Secondary | ICD-10-CM | POA: Diagnosis present

## 2021-02-16 DIAGNOSIS — O9902 Anemia complicating childbirth: Secondary | ICD-10-CM | POA: Diagnosis present

## 2021-02-16 DIAGNOSIS — Z20822 Contact with and (suspected) exposure to covid-19: Secondary | ICD-10-CM | POA: Diagnosis present

## 2021-02-16 DIAGNOSIS — D649 Anemia, unspecified: Secondary | ICD-10-CM

## 2021-02-16 DIAGNOSIS — O99824 Streptococcus B carrier state complicating childbirth: Secondary | ICD-10-CM | POA: Diagnosis present

## 2021-02-16 DIAGNOSIS — R8271 Bacteriuria: Secondary | ICD-10-CM | POA: Diagnosis present

## 2021-02-16 DIAGNOSIS — O326XX0 Maternal care for compound presentation, not applicable or unspecified: Secondary | ICD-10-CM | POA: Diagnosis not present

## 2021-02-16 DIAGNOSIS — Z3403 Encounter for supervision of normal first pregnancy, third trimester: Secondary | ICD-10-CM

## 2021-02-16 DIAGNOSIS — Z3A39 39 weeks gestation of pregnancy: Secondary | ICD-10-CM

## 2021-02-16 DIAGNOSIS — O26893 Other specified pregnancy related conditions, third trimester: Secondary | ICD-10-CM | POA: Diagnosis present

## 2021-02-16 DIAGNOSIS — O4202 Full-term premature rupture of membranes, onset of labor within 24 hours of rupture: Secondary | ICD-10-CM | POA: Diagnosis not present

## 2021-02-16 DIAGNOSIS — O4290 Premature rupture of membranes, unspecified as to length of time between rupture and onset of labor, unspecified weeks of gestation: Secondary | ICD-10-CM | POA: Diagnosis present

## 2021-02-16 LAB — CBC
HCT: 34.6 % — ABNORMAL LOW (ref 36.0–46.0)
Hemoglobin: 11.6 g/dL — ABNORMAL LOW (ref 12.0–15.0)
MCH: 32.2 pg (ref 26.0–34.0)
MCHC: 33.5 g/dL (ref 30.0–36.0)
MCV: 96.1 fL (ref 80.0–100.0)
Platelets: 157 10*3/uL (ref 150–400)
RBC: 3.6 MIL/uL — ABNORMAL LOW (ref 3.87–5.11)
RDW: 13.2 % (ref 11.5–15.5)
WBC: 9.7 10*3/uL (ref 4.0–10.5)
nRBC: 0 % (ref 0.0–0.2)

## 2021-02-16 LAB — TYPE AND SCREEN
ABO/RH(D): O POS
Antibody Screen: NEGATIVE

## 2021-02-16 LAB — RESP PANEL BY RT-PCR (FLU A&B, COVID) ARPGX2
Influenza A by PCR: NEGATIVE
Influenza B by PCR: NEGATIVE
SARS Coronavirus 2 by RT PCR: NEGATIVE

## 2021-02-16 LAB — POCT FERN TEST: POCT Fern Test: POSITIVE

## 2021-02-16 MED ORDER — SOD CITRATE-CITRIC ACID 500-334 MG/5ML PO SOLN
30.0000 mL | ORAL | Status: DC | PRN
Start: 2021-02-16 — End: 2021-02-17

## 2021-02-16 MED ORDER — FENTANYL CITRATE (PF) 100 MCG/2ML IJ SOLN: 50.0000 ug | INTRAMUSCULAR | Status: DC | PRN

## 2021-02-16 MED ORDER — PENICILLIN G POT IN DEXTROSE 60000 UNIT/ML IV SOLN
3.0000 10*6.[IU] | INTRAVENOUS | Status: DC
  Filled 2021-02-16 (×2): qty 50

## 2021-02-16 MED ORDER — MISOPROSTOL 50MCG HALF TABLET: 50.0000 ug | ORAL_TABLET | ORAL | Status: DC | PRN

## 2021-02-16 MED ORDER — SODIUM CHLORIDE 0.9 % IV SOLN
5.0000 10*6.[IU] | Freq: Once | INTRAVENOUS | Status: AC
  Administered 2021-02-16: 5 10*6.[IU] via INTRAVENOUS
  Filled 2021-02-16: qty 5

## 2021-02-16 MED ORDER — OXYCODONE-ACETAMINOPHEN 5-325 MG PO TABS: 1.0000 | ORAL_TABLET | ORAL | Status: DC | PRN

## 2021-02-16 MED ORDER — PENICILLIN G POT IN DEXTROSE 60000 UNIT/ML IV SOLN
3.0000 10*6.[IU] | INTRAVENOUS | Status: DC
  Administered 2021-02-16 – 2021-02-17 (×4): 3 10*6.[IU] via INTRAVENOUS
  Filled 2021-02-16 (×5): qty 50

## 2021-02-16 MED ORDER — OXYTOCIN-SODIUM CHLORIDE 30-0.9 UT/500ML-% IV SOLN: 2.5000 [IU]/h | INTRAVENOUS | Status: DC

## 2021-02-16 MED ORDER — OXYCODONE-ACETAMINOPHEN 5-325 MG PO TABS
2.0000 | ORAL_TABLET | ORAL | Status: DC | PRN
Start: 2021-02-16 — End: 2021-02-17

## 2021-02-16 MED ORDER — LACTATED RINGERS IV SOLN
500.0000 mL | INTRAVENOUS | Status: DC | PRN
  Administered 2021-02-17: 1000 mL via INTRAVENOUS

## 2021-02-16 MED ORDER — OXYTOCIN-SODIUM CHLORIDE 30-0.9 UT/500ML-% IV SOLN
1.0000 m[IU]/min | INTRAVENOUS | Status: DC
  Administered 2021-02-16: 2 m[IU]/min via INTRAVENOUS
  Filled 2021-02-16: qty 500

## 2021-02-16 MED ORDER — ACETAMINOPHEN 325 MG PO TABS: 650.0000 mg | ORAL_TABLET | ORAL | Status: DC | PRN

## 2021-02-16 MED ORDER — LACTATED RINGERS IV SOLN: INTRAVENOUS | Status: DC

## 2021-02-16 MED ORDER — LIDOCAINE HCL (PF) 1 % IJ SOLN: 30.0000 mL | INTRAMUSCULAR | Status: DC | PRN

## 2021-02-16 MED ORDER — TERBUTALINE SULFATE 1 MG/ML IJ SOLN: 0.2500 mg | Freq: Once | INTRAMUSCULAR | Status: DC | PRN

## 2021-02-16 MED ORDER — OXYTOCIN BOLUS FROM INFUSION
333.0000 mL | Freq: Once | INTRAVENOUS | Status: AC
  Administered 2021-02-17: 333 mL via INTRAVENOUS

## 2021-02-16 MED ORDER — ONDANSETRON HCL 4 MG/2ML IJ SOLN
4.0000 mg | Freq: Four times a day (QID) | INTRAMUSCULAR | Status: DC | PRN
  Administered 2021-02-16: 4 mg via INTRAVENOUS
  Filled 2021-02-16: qty 2

## 2021-02-16 NOTE — Progress Notes (Signed)
Labor Progress Note Lorraine Barker is a 30 y.o. G1P0000 at [redacted]w[redacted]d presented for SROM  S:  Patient reports worsening cramping but still tolerable   O:  BP 137/75   Pulse 82   Temp 98.5 F (36.9 C) (Oral)   Resp 18   Ht 5' 3.5" (1.613 m)   Wt 91.4 kg   LMP 02/11/2020   SpO2 99%   BMI 35.15 kg/m   Fetal Tracing:  Baseline: 140 Variability: moderate Accels: 15x15 Decels: none  Toco: 5-10   CVE: Dilation: 4 Effacement (%): 80 Station: -2 Presentation: Vertex Exam by:: Cleone Slim CNM   A&P: 30 y.o. G1P0000 [redacted]w[redacted]d SROM #Labor: Cervix unchanged. Risks and benefits of pitocin augmentation reviewed. Options of continued expectant management and nipple stimulation discussed. Patient desires waterbirth and discussed that we can trial turning pitocin off once active but may need pitocin to continue labor. Patient desires pitocin augmentation at this time. Will start 2x2 #Pain: n/a #FWB: Cat 1 #GBS positive   [name], CNM 8:50 PM

## 2021-02-16 NOTE — MAU Note (Signed)
Lorraine Barker is a 30 y.o. at [redacted]w[redacted]d here in MAU reporting: LOF around 0600, had some more leaking in the lobby, fluid is clear. Having some abdominal cramping. No recent IC. No bleeding.   Onset of complaint: today  Pain score: 4/10  Vitals:   02/16/21 1106  BP: 135/82  Pulse: 86  Resp: 16  Temp: 98.1 F (36.7 C)  SpO2: 98%     FHT:146  Lab orders placed from triage: none

## 2021-02-16 NOTE — H&P (Signed)
HPI: Lorraine Barker is a 30 y.o. year old G42P0000 female at [redacted]w[redacted]d weeks gestation who presents to MAU reporting Spontaneous rupture of membranes, clear fluid at 0600 and mild cramping.  Planning waterbirth.   Nursing Staff Provider  Office Location  [facility] Dating  Bedside U/S at 6 weeks  Language  English Anatomy US   normal  Flu Vaccine  Declined  Genetic Screen  NIPS:Low Risk  AFP: negative     TDaP Vaccine   12/03/20 Hgb A1C or  GTT Early  Third trimester :  Glucose, Fasting, Gest 65 - 91 mg/dL 83    Glucose, 1 Hr, Gest 65 - 179 mg/dL 785    Glucose, 2 Hr, Gest 65 - 152 mg/dL 885       COVID Vaccine 12/21   LAB RESULTS   Rhogam  NA Blood Type O/RH(D) POSITIVE/-- (01/21 1112)   Feeding Plan Breast  Antibody NO ANTIBODIES DETECTED (01/21 1112)  Contraception Decline Rubella 7.68 (01/21 1112)  Circumcision Yes RPR NON-REACTIVE (01/21 1112)   Pediatrician  Roderic Ovens [facility] HBsAg NON-REACTIVE (01/21 1112)   Support Person Ebony Cargo HCVAb Neg  Prenatal Classes  yes HIV  NR  BTL Consent NA GBS  Positive in urine   VBAC Consent NA  Pap 10/19 NML    Hgb Electro  Neg  BP Cuff Pt has cuff CF Neg    SMA Neg    Waterbirth  [ X] Class Arly.Keller ] Consent [ X] CNM visit    Induction  [ ]  Orders Entered [ ] Foley Y/N   OB History     Gravida  1   Para  0   Term  0   Preterm  0   AB  0   Living  0      SAB  0   IAB  0   Ectopic  0   Multiple  0   Live Births  0          Past Medical History:  Diagnosis Date   Kidney stone    Ovarian cyst    Past Surgical History:  Procedure Laterality Date   APPENDECTOMY     WISDOM TOOTH EXTRACTION     Family History: family history includes Diverticulitis in her father; Hypertension in her mother; Kidney Stones in her father and mother; Ovarian cysts in her mother. Social History:  reports that she has never smoked. She has never used smokeless tobacco. She reports that she does not currently use alcohol. She reports that she does not use  drugs.     Maternal Diabetes: No Genetic Screening: Normal Maternal Ultrasounds/Referrals: Normal Fetal Ultrasounds or other Referrals:  None Maternal Substance Abuse:  No Significant Maternal Medications:  None Significant Maternal Lab Results:  Group B Strep positive Other Comments:  None  Review of Systems  Constitutional:  Negative for chills and fever.  Eyes:  Negative for visual disturbance.  Respiratory:  Negative for cough.   Gastrointestinal:  Positive for abdominal pain (mild contractions).  Genitourinary:  Positive for vaginal discharge (LOF, clear). Negative for vaginal bleeding.  Neurological:  Negative for headaches.  Maternal Medical History:  Reason for admission: Rupture of membranes and contractions.  0600 clear fluid  Contractions: Onset was 1-2 hours ago.   Frequency: irregular.   Perceived severity is mild.   Fetal activity: Perceived fetal activity is normal.   Prenatal complications: No bleeding or PIH (x 1).   Prenatal Complications - Diabetes: none.  Exam by::  CNM Blood pressure 139/84, pulse 84, temperature 98.1 F (36.7 C), temperature source Oral, resp. rate 18, height 5' 3.5" (1.613 m), weight 91.4 kg, last menstrual period 02/11/2020, SpO2 99 %. Maternal Exam:  Uterine Assessment: Contraction strength is mild.  Contraction frequency is irregular.  Abdomen: Patient reports no abdominal tenderness. Estimated fetal weight is 8 lb.   Fetal presentation: vertex Vtx by BS [place] Introitus: Normal vulva. Normal vagina.  Ferning test: positive.  Amniotic fluid character: clear. Pelvis: adequate for delivery.   Cervix: Cervix evaluated by digital exam.     Fetal Exam Fetal Monitor Review: Baseline rate: 135.  Variability: moderate (6-25 bpm).   Pattern: accelerations present and no decelerations.   Fetal State Assessment: Category I - tracings are normal.  Physical Exam Constitutional:      General: She is not in acute distress.     Appearance: Normal appearance. She is normal weight. She is not ill-appearing.  HENT:     Head: Normocephalic.  Eyes:     General: No scleral icterus. Cardiovascular:     Rate and Rhythm: Normal rate.  Pulmonary:     Effort: Pulmonary effort is normal. No respiratory distress.  Abdominal:     Palpations: Abdomen is soft.     Tenderness: There is no abdominal tenderness.  Genitourinary:    General: Normal vulva.  Musculoskeletal:        General: No swelling.     Right lower leg: No edema.     Left lower leg: No edema.  Skin:    General: Skin is warm and dry.  Neurological:     General: No focal deficit present.     Mental Status: She is alert and oriented to person, place, and time.  Psychiatric:        Mood and Affect: Mood normal.    Prenatal labs: ABO, Rh: --/--/PENDING (08/21 1225) Antibody: PENDING (08/21 1225) Rubella: 7.68 (01/21 1112) RPR: NON-REACTIVE (06/07 0919)  HBsAg: NON-REACTIVE (01/21 1112)  HIV: NON-REACTIVE (06/07 0919)  GBS:   Pos in urine  Assessment: 1. Labor: Early/SROM x 7 hours 2. Fetal Wellbeing: Category I  3. Pain Control: Planning hydrotherapy 4. GBS: Pos 5. 39.0 week IUP  Plan:  1. Admit to BS per consult with MD 2. Routine L&D orders 3. Analgesia/anesthesia PRN  4. PCN for GBS prophylaxis 5. If Covid test is neg pt may labor and deliver in water.   Lorraine Barker 02/16/2021, 1:00 PM

## 2021-02-16 NOTE — Progress Notes (Signed)
Vtx by BSUS 

## 2021-02-17 ENCOUNTER — Inpatient Hospital Stay (HOSPITAL_COMMUNITY): Payer: 59 | Admitting: Anesthesiology

## 2021-02-17 DIAGNOSIS — O99820 Streptococcus B carrier state complicating pregnancy: Secondary | ICD-10-CM

## 2021-02-17 DIAGNOSIS — O326XX0 Maternal care for compound presentation, not applicable or unspecified: Secondary | ICD-10-CM

## 2021-02-17 DIAGNOSIS — O4202 Full-term premature rupture of membranes, onset of labor within 24 hours of rupture: Secondary | ICD-10-CM

## 2021-02-17 DIAGNOSIS — Z3A39 39 weeks gestation of pregnancy: Secondary | ICD-10-CM

## 2021-02-17 LAB — RPR: RPR Ser Ql: NONREACTIVE

## 2021-02-17 MED ORDER — DIPHENHYDRAMINE HCL 25 MG PO CAPS: 25.0000 mg | ORAL_CAPSULE | Freq: Four times a day (QID) | ORAL | Status: DC | PRN

## 2021-02-17 MED ORDER — ZOLPIDEM TARTRATE 5 MG PO TABS
5.0000 mg | ORAL_TABLET | Freq: Every evening | ORAL | Status: DC | PRN
Start: 2021-02-17 — End: 2021-02-18

## 2021-02-17 MED ORDER — ONDANSETRON HCL 4 MG/2ML IJ SOLN
4.0000 mg | Freq: Once | INTRAMUSCULAR | Status: AC
  Administered 2021-02-17: 4 mg via INTRAVENOUS
  Filled 2021-02-17: qty 2

## 2021-02-17 MED ORDER — IBUPROFEN 600 MG PO TABS
600.0000 mg | ORAL_TABLET | Freq: Four times a day (QID) | ORAL | Status: DC
  Administered 2021-02-17 – 2021-02-18 (×5): 600 mg via ORAL
  Filled 2021-02-17 (×5): qty 1

## 2021-02-17 MED ORDER — PRENATAL MULTIVITAMIN CH
1.0000 | ORAL_TABLET | Freq: Every day | ORAL | Status: DC
  Administered 2021-02-17 – 2021-02-18 (×2): 1 via ORAL
  Filled 2021-02-17 (×2): qty 1

## 2021-02-17 MED ORDER — FENTANYL CITRATE (PF) 100 MCG/2ML IJ SOLN
100.0000 ug | Freq: Once | INTRAMUSCULAR | Status: AC
  Administered 2021-02-17: 100 ug via INTRAVENOUS
  Filled 2021-02-17: qty 2

## 2021-02-17 MED ORDER — EPHEDRINE 5 MG/ML INJ: 10.0000 mg | INTRAVENOUS | Status: DC | PRN

## 2021-02-17 MED ORDER — DIBUCAINE (PERIANAL) 1 % EX OINT: 1.0000 "application " | TOPICAL_OINTMENT | CUTANEOUS | Status: DC | PRN

## 2021-02-17 MED ORDER — SIMETHICONE 80 MG PO CHEW: 80.0000 mg | CHEWABLE_TABLET | ORAL | Status: DC | PRN

## 2021-02-17 MED ORDER — COCONUT OIL OIL
1.0000 | TOPICAL_OIL | Status: DC | PRN
Start: 2021-02-17 — End: 2021-02-18

## 2021-02-17 MED ORDER — DIPHENHYDRAMINE HCL 50 MG/ML IJ SOLN
12.5000 mg | INTRAMUSCULAR | Status: DC | PRN
Start: 2021-02-17 — End: 2021-02-17
  Administered 2021-02-17: 12.5 mg via INTRAVENOUS
  Filled 2021-02-17: qty 1

## 2021-02-17 MED ORDER — PHENYLEPHRINE 40 MCG/ML (10ML) SYRINGE FOR IV PUSH (FOR BLOOD PRESSURE SUPPORT)
80.0000 ug | PREFILLED_SYRINGE | INTRAVENOUS | Status: DC | PRN
Start: 2021-02-17 — End: 2021-02-17

## 2021-02-17 MED ORDER — ONDANSETRON HCL 4 MG/2ML IJ SOLN: 4.0000 mg | INTRAMUSCULAR | Status: DC | PRN

## 2021-02-17 MED ORDER — FENTANYL-BUPIVACAINE-NACL 0.5-0.125-0.9 MG/250ML-% EP SOLN
12.0000 mL/h | EPIDURAL | Status: DC | PRN
  Administered 2021-02-17: 12 mL/h via EPIDURAL
  Filled 2021-02-17: qty 250

## 2021-02-17 MED ORDER — WITCH HAZEL-GLYCERIN EX PADS
1.0000 | MEDICATED_PAD | CUTANEOUS | Status: DC | PRN
Start: 2021-02-17 — End: 2021-02-18

## 2021-02-17 MED ORDER — ONDANSETRON HCL 4 MG/2ML IJ SOLN: 8.0000 mg | Freq: Four times a day (QID) | INTRAMUSCULAR | Status: DC | PRN

## 2021-02-17 MED ORDER — TETANUS-DIPHTH-ACELL PERTUSSIS 5-2.5-18.5 LF-MCG/0.5 IM SUSY: 0.5000 mL | PREFILLED_SYRINGE | Freq: Once | INTRAMUSCULAR | Status: DC

## 2021-02-17 MED ORDER — LIDOCAINE HCL (PF) 1 % IJ SOLN
INTRAMUSCULAR | Status: DC | PRN
  Administered 2021-02-17: 12 mL via EPIDURAL

## 2021-02-17 MED ORDER — ONDANSETRON HCL 4 MG PO TABS: 4.0000 mg | ORAL_TABLET | ORAL | Status: DC | PRN

## 2021-02-17 MED ORDER — SENNOSIDES-DOCUSATE SODIUM 8.6-50 MG PO TABS
2.0000 | ORAL_TABLET | Freq: Every day | ORAL | Status: DC
  Administered 2021-02-18: 2 via ORAL
  Filled 2021-02-17: qty 2

## 2021-02-17 MED ORDER — BENZOCAINE-MENTHOL 20-0.5 % EX AERO
1.0000 "application " | INHALATION_SPRAY | CUTANEOUS | Status: DC | PRN
  Administered 2021-02-17: 1 via TOPICAL
  Filled 2021-02-17: qty 56

## 2021-02-17 MED ORDER — PHENYLEPHRINE 40 MCG/ML (10ML) SYRINGE FOR IV PUSH (FOR BLOOD PRESSURE SUPPORT): 80.0000 ug | PREFILLED_SYRINGE | INTRAVENOUS | Status: DC | PRN

## 2021-02-17 MED ORDER — SODIUM CHLORIDE 0.9 % IV SOLN
25.0000 mg | Freq: Four times a day (QID) | INTRAVENOUS | Status: DC | PRN
  Filled 2021-02-17: qty 1

## 2021-02-17 MED ORDER — LACTATED RINGERS IV SOLN: 500.0000 mL | Freq: Once | INTRAVENOUS | Status: DC

## 2021-02-17 MED ORDER — ACETAMINOPHEN 325 MG PO TABS: 650.0000 mg | ORAL_TABLET | ORAL | Status: DC | PRN

## 2021-02-17 NOTE — Anesthesia Postprocedure Evaluation (Signed)
Anesthesia Post Note  Patient: Lorraine Barker  Procedure(s) Performed: AN AD HOC LABOR EPIDURAL     Patient location during evaluation: Mother Baby Anesthesia Type: Epidural Level of consciousness: awake Pain management: satisfactory to patient Vital Signs Assessment: post-procedure vital signs reviewed and stable Respiratory status: spontaneous breathing Cardiovascular status: stable Anesthetic complications: no   No notable events documented.  Last Vitals:  Vitals:   02/17/21 1050 02/17/21 1200  BP: 124/67 118/66  Pulse: 79 65  Resp: 18 18  Temp: 36.6 C 36.6 C  SpO2:      Last Pain:  Vitals:   02/17/21 1500  TempSrc:   PainSc: 0-No pain   Pain Goal:                   [employer]

## 2021-02-17 NOTE — Progress Notes (Signed)
Labor Progress Note Miami Hershberger is a 30 y.o. G1P0000 at [redacted]w[redacted]d presented for SROM  S:  Patient with urge to push  O:  BP 134/80   Pulse (!) 104   Temp 98.9 F (37.2 C) (Oral)   Resp 17   Ht 5' 3.5" (1.613 m)   Wt 91.4 kg   LMP 02/11/2020   SpO2 98%   BMI 35.15 kg/m   Fetal Tracing:  Baseline: 150 Variability: moderate Accels: none Decels: early  Toco: 2-3   CVE: Dilation: Lip/rim Dilation Complete Date: 02/17/21 Dilation Complete Time: 0710 Effacement (%): 100 Station: Plus 2 Presentation: Vertex Exam by:: Lake Bells, CNM   A&P: 30 y.o. G1P0000 [redacted]w[redacted]d SROM #Labor: Progressing well. Changing positions to try to reduce lip. Encouraged patient to not push until complete even though she is very uncomfortable #Pain: epidural #FWB: Cat 2 #GBS negative   [name], CNM 7:46 AM

## 2021-02-17 NOTE — Progress Notes (Signed)
Patient decided she desires epidural and no longer wants waterbirth. Will restart pitocin at half of where it was stopped, 50mu/min.   [name], CNM 02/17/21 3:34 AM

## 2021-02-17 NOTE — Progress Notes (Signed)
Labor Progress Note Lorraine Barker is a 30 y.o. G1P0000 at [redacted]w[redacted]d presented for SROM  S:  Patient very uncomfortable  O:  BP 139/63   Pulse 86   Temp 98.4 F (36.9 C) (Oral)   Resp 17   Ht 5' 3.5" (1.613 m)   Wt 91.4 kg   LMP 02/11/2020   SpO2 99%   BMI 35.15 kg/m   Fetal Tracing:  Baseline: 145 Variability: moderate Accels: 15x15 Decels: none  Toco: 4-5   CVE: Dilation: 5 Effacement (%): 80 Station: -1 Presentation: Vertex Exam by:: Cleone Slim CNM   A&P: 30 y.o. G1P0000 [redacted]w[redacted]d SROM #Labor: Small amount of cervical change. Will continue pitocin. Patient agreeable to plan of care #Pain: nitrous #FWB: Cat 1 #GBS positive  [name], CNM 12:03 AM

## 2021-02-17 NOTE — Progress Notes (Signed)
Labor Progress Note Lorraine Barker is a 30 y.o. G1P0000 at [redacted]w[redacted]d presented for SROM  S:  Patient very uncomfortable and reporting intermittent pressure with contractions  O:  BP 135/60   Pulse 70   Temp 98.4 F (36.9 C)   Resp 17   Ht 5' 3.5" (1.613 m)   Wt 91.4 kg   LMP 02/11/2020   SpO2 99%   BMI 35.15 kg/m   Fetal Tracing:  Baseline: 145 Variability: moderate Accels: 15x15 Decels: early  Toco: 2-4   CVE: Dilation: 6 Effacement (%): 100 Station: 0 Presentation: Vertex Exam by:: Cleone Slim CNM   A&P: 30 y.o. G1P0000 [redacted]w[redacted]d SROM #Labor: Progressing well. Will discontinue pitocin and attempt to move to waterbirth. Patient understands that if labor stalls, pitocin will be restarted and waterbirth cannot occur.  #Pain: hydrotherapy #FWB: Cat 1 #GBS positive  [name], [place] 2:24 AM08/22/22

## 2021-02-17 NOTE — Anesthesia Procedure Notes (Signed)
Epidural Patient location during procedure: OB Start time: 02/17/2021 2:50 AM End time: 02/17/2021 2:55 AM  Staffing Anesthesiologist: Leilani Able, MD Performed: anesthesiologist   Preanesthetic Checklist Completed: patient identified, IV checked, site marked, risks and benefits discussed, surgical consent, monitors and equipment checked, pre-op evaluation and timeout performed  Epidural Patient position: sitting Prep: DuraPrep and site prepped and draped Patient monitoring: continuous pulse ox and blood pressure Approach: midline Location: L3-L4 Injection technique: LOR air  Needle:  Needle type: Tuohy  Needle gauge: 17 G Needle length: 9 cm and 9 Needle insertion depth: 5 cm cm Catheter type: closed end flexible Catheter size: 19 Gauge Catheter at skin depth: 10 cm Test dose: negative and Other  Assessment Events: blood not aspirated, injection not painful, no injection resistance, no paresthesia and negative IV test  Additional Notes Reason for block:procedure for pain

## 2021-02-17 NOTE — Anesthesia Preprocedure Evaluation (Signed)
Anesthesia Evaluation  Patient identified by MRN, date of birth, ID band Patient awake    Reviewed: Allergy & Precautions, H&P , NPO status , Patient's Chart, lab work & pertinent test results  Airway Mallampati: II  TM Distance: >3 FB Neck ROM: full    Dental no notable dental hx.    Pulmonary neg pulmonary ROS,    Pulmonary exam normal breath sounds clear to auscultation       Cardiovascular negative cardio ROS Normal cardiovascular exam Rhythm:regular Rate:Normal     Neuro/Psych negative neurological ROS  negative psych ROS   GI/Hepatic negative GI ROS, Neg liver ROS,   Endo/Other  negative endocrine ROS  Renal/GU   negative genitourinary   Musculoskeletal negative musculoskeletal ROS (+)   Abdominal (+) + obese,   Peds  Hematology negative hematology ROS (+)   Anesthesia Other Findings   Reproductive/Obstetrics (+) Pregnancy                             Anesthesia Physical Anesthesia Plan  ASA: 2  Anesthesia Plan: Epidural   Post-op Pain Management:    Induction:   PONV Risk Score and Plan:   Airway Management Planned:   Additional Equipment:   Intra-op Plan:   Post-operative Plan:   Informed Consent: I have reviewed the patients History and Physical, chart, labs and discussed the procedure including the risks, benefits and alternatives for the proposed anesthesia with the patient or authorized representative who has indicated his/her understanding and acceptance.       Plan Discussed with:   Anesthesia Plan Comments:         Anesthesia Quick Evaluation

## 2021-02-17 NOTE — Lactation Note (Signed)
This note was copied from a baby's chart. Lactation Consultation Note  Patient Name: Boy Amiliana Foutz [id number] Date: 02/17/2021 Reason for consult: L&D Initial assessment Age:33 hours  Mom/baby were seen while on L&D. Latching was attempted on both breasts. Infant would latch briefly, but would then lose suction (Mom's nipples appear short-shafted at this time). After multiple attempts of trying to sustain latch, infant was placed skin-to-skin on Mom.    Lurline Hare [facility] 02/17/2021, 9:35 AM

## 2021-02-17 NOTE — Lactation Note (Signed)
This note was copied from a baby's chart. Lactation Consultation Note  Patient Name: Lorraine Barker [id number] Date: 02/17/2021 Reason for consult: Initial assessment;Term;Primapara;1st time breastfeeding Age:32 hours   P1 mother whose infant is now 57 hours old.  This is a term baby at 39+1 weeks.  Baby was swaddled and asleep in father's arms when I arrived.  Reviewed breast feeding basics with parents.  Mother has been taught hand expression and is able to obtain a couple of colostrum drops.  Education provided regarding finger/spoon feeding any EBM she obtains.    Encouraged to feed 8-12 times/24 hours or sooner if baby shows feeding cues.  Suggested mother call her RN/LC for latch assistance as needed.    Mom made aware of O/P services, breastfeeding support groups, community resources, and our phone # for post-discharge questions.  Mother has a DEBP for home use.  Grandmother present but getting ready to leave.   Maternal Data Has patient been taught Hand Expression?: Yes Does the patient have breastfeeding experience prior to this delivery?: No  Feeding Mother's Current Feeding Choice: Breast Milk  LATCH Score Latch: Repeated attempts needed to sustain latch, nipple held in mouth throughout feeding, stimulation needed to elicit sucking reflex.  Audible Swallowing: None  Type of Nipple: Everted at rest and after stimulation (short-shafted)  Comfort (Breast/Nipple): Soft / non-tender  Hold (Positioning): Full assist, staff holds infant at breast  LATCH Score: 5   Lactation Tools Discussed/Used    Interventions Interventions: Breast feeding basics reviewed;Education  Discharge Pump: Personal WIC Program: No  Consult Status Consult Status: Follow-up Date: 02/18/21 Follow-up type: In-patient    Dora Sims 02/17/2021, 11:44 AM

## 2021-02-18 ENCOUNTER — Encounter (HOSPITAL_COMMUNITY): Payer: Self-pay

## 2021-02-18 DIAGNOSIS — D649 Anemia, unspecified: Secondary | ICD-10-CM

## 2021-02-18 LAB — CBC
HCT: 29.8 % — ABNORMAL LOW (ref 36.0–46.0)
Hemoglobin: 9.9 g/dL — ABNORMAL LOW (ref 12.0–15.0)
MCH: 32.5 pg (ref 26.0–34.0)
MCHC: 33.2 g/dL (ref 30.0–36.0)
MCV: 97.7 fL (ref 80.0–100.0)
Platelets: 113 10*3/uL — ABNORMAL LOW (ref 150–400)
RBC: 3.05 MIL/uL — ABNORMAL LOW (ref 3.87–5.11)
RDW: 13.5 % (ref 11.5–15.5)
WBC: 9.4 10*3/uL (ref 4.0–10.5)
nRBC: 0 % (ref 0.0–0.2)

## 2021-02-18 MED ORDER — FERROUS SULFATE 325 (65 FE) MG PO TABS: 325.0000 mg | ORAL_TABLET | ORAL | 3 refills | Status: DC

## 2021-02-18 MED ORDER — ACETAMINOPHEN 325 MG PO TABS: 650.0000 mg | ORAL_TABLET | ORAL | 0 refills | Status: DC | PRN

## 2021-02-18 MED ORDER — IBUPROFEN 600 MG PO TABS: 600.0000 mg | ORAL_TABLET | Freq: Four times a day (QID) | ORAL | 0 refills | Status: DC

## 2021-02-18 NOTE — Lactation Note (Addendum)
This note was copied from a baby's chart. Lactation Consultation Note  Patient Name: Lorraine Barker [id number] Date: 02/18/2021 Reason for consult: Term;1st time breastfeeding Age:32 hours Per mom, infant has not been latch well at breast most feedings  are 2 minutes or less, and infant has not been sustaining latch,  mom has been doing a lot of skin to skin. Mom open to trying a different feeding position. Mom did breast stimulation prior to latching infant at the breast. Mom latched infant on her left breast using the football hold position, infant latched with depth and sustained latch, was still breastfeeding after 15 minutes when LC left the room.  Parents understand that infant feeding plan may changed based on infant feeding behaviors. Mom's plan: 1- Mom will latch infant according to feeding cues, 8 to 12 pr more times within 24 hours, skin to skin.  2- Mom will ask RN or LC for latch assistance if needed.  3- Mom will attempt to latch infant on both breast during feeding.  Maternal Data    Feeding Mother's Current Feeding Choice: Breast Milk  LATCH Score Latch: Grasps breast easily, tongue down, lips flanged, rhythmical sucking.  Audible Swallowing: Spontaneous and intermittent  Type of Nipple: Flat (Mom did breast stimulation prior to latching infant at the breast.)  Comfort (Breast/Nipple): Soft / non-tender  Hold (Positioning): Assistance needed to correctly position infant at breast and maintain latch.  LATCH Score: 8   Lactation Tools Discussed/Used    Interventions Interventions: Skin to skin;Position options;Support pillows;Adjust position;Breast compression;Education  Discharge    Consult Status Consult Status: Follow-up Date: 02/18/21 Follow-up type: In-patient    Danelle Earthly 02/18/2021, 1:30 AM

## 2021-02-18 NOTE — Lactation Note (Signed)
This note was copied from a baby's chart. Lactation Consultation Note  Patient Name: Lorraine Barker Date: 02/18/2021 Reason for consult: Follow-up assessment;Term;Primapara;1st time breastfeeding Age:32 hours   P1 mother whose infant is now 74 hours old.  This is a term baby at 39+1 weeks.  Baby was in the nursery for a circumcision when I arrived.  Reviewed feeding plan for after discharge.  Mother had no questions/concerns related to breast feeding.  Last LATCH score was an 8; baby voiding/stooling well.  Reviewed the possibility of baby being very sleepy today/tonight after circumcision.  Informed parents that he may awaken more during the night time hours to feed more often.    Mother has a DEBP for home use.  She also has our OP phone number for any questions/concerns after discharge.   Maternal Data Has patient been taught Hand Expression?: Yes Does the patient have breastfeeding experience prior to this delivery?: No  Feeding Mother's Current Feeding Choice: Breast Milk  LATCH Score                    Lactation Tools Discussed/Used    Interventions Interventions: Education  Discharge Discharge Education: Engorgement and breast care Pump: Personal;Manual  Consult Status Consult Status: Complete Date: 02/18/21 Follow-up type: Call as needed    Lorraine Barker Lorraine Barker 02/18/2021, 11:01 AM

## 2021-02-18 NOTE — Discharge Summary (Signed)
Postpartum Discharge Summary   Patient Name: Lorraine Barker DOB: 23-Dec-1990 MRN: [id number]  Date of admission: 02/16/2021 Delivery date:02/17/2021  Delivering provider: Wende Mott  Date of discharge: 02/18/2021  Admitting diagnosis: Indication for care in labor and delivery, antepartum [O75.9] Intrauterine pregnancy: [redacted]w[redacted]d    Secondary diagnosis:  Active Problems:   Encounter for supervision of normal pregnancy   GBS bacteriuria   Leakage of amniotic fluid   Anemia  Additional problems: None    Discharge diagnosis: Term Pregnancy Delivered                                              Post partum procedures: None Augmentation: Pitocin Complications: None  Hospital course: Induction of Labor With Vaginal Delivery   30y.o. yo G1P0000 at 372w2das admitted to the hospital 02/16/2021 for induction of labor.  Indication for induction: PROM.  Patient had an uncomplicated labor course as follows: Membrane Rupture Time/Date: 6:00 AM ,02/16/2021   Delivery Method:Vaginal, Spontaneous  Episiotomy: None  Lacerations:  2nd degree  Details of delivery can be found in separate delivery note.  Patient had a routine postpartum course. Patient is discharged home 02/18/21.  Newborn Data: Birth date:02/17/2021  Birth time:8:36 AM  Gender:Female  Living status:Living  Apgars:8 ,9  Weight:3586 g   Magnesium Sulfate received: No BMZ received: No Rhophylac:N/A MMR:N/A T-DaP:Given prenatally Flu: N/A Transfusion:No  Physical exam  Vitals:   02/17/21 1600 02/17/21 2005 02/17/21 2304 02/18/21 0533  BP: 120/68 124/66 116/70 120/77  Pulse: (!) 59 85 81 89  Resp: '18 18 18 18  ' Temp: 98 F (36.7 C) 98.6 F (37 C) 98.1 F (36.7 C) 98.3 F (36.8 C)  TempSrc: Oral Oral Oral Oral  SpO2:  99% 98% 98%  Weight:      Height:       General: alert Lochia: appropriate Uterine Fundus: firm DVT Evaluation: No evidence of DVT seen on physical exam. Labs: Lab Results  Component Value Date    WBC 9.4 02/18/2021   HGB 9.9 (L) 02/18/2021   HCT 29.8 (L) 02/18/2021   MCV 97.7 02/18/2021   PLT 113 (L) 02/18/2021   CMP Latest Ref Rng & Units 01/31/2020  Glucose 70 - 99 mg/dL 95  BUN 6 - 20 mg/dL 13  Creatinine 0.44 - 1.00 mg/dL 0.91  Sodium 135 - 145 mmol/L 136  Potassium 3.5 - 5.1 mmol/L 3.8  Chloride 98 - 111 mmol/L 106  CO2 22 - 32 mmol/L 22  Calcium 8.9 - 10.3 mg/dL 9.1  Total Protein 6.5 - 8.1 g/dL 9.3(H)  Total Bilirubin 0.3 - 1.2 mg/dL 0.7  Alkaline Phos 38 - 126 U/L 55  AST 15 - 41 U/L 23  ALT 0 - 44 U/L 22   Edinburgh Score: Edinburgh Postnatal Depression Scale Screening Tool 02/17/2021  I have been able to laugh and see the funny side of things. 0  I have looked forward with enjoyment to things. 0  I have blamed myself unnecessarily when things went wrong. 1  I have been anxious or worried for no good reason. 0  I have felt scared or panicky for no good reason. 0  Things have been getting on top of me. 1  I have been so unhappy that I have had difficulty sleeping. 0  I have felt sad or miserable. 0  I have been so unhappy that I have been crying. 0  The thought of harming myself has occurred to me. 0  Edinburgh Postnatal Depression Scale Total 2     After visit meds:  Allergies as of 02/18/2021   No Known Allergies      Medication List     STOP taking these medications    aspirin EC 81 MG tablet       TAKE these medications    acetaminophen 325 MG tablet Commonly known as: Tylenol Take 2 tablets (650 mg total) by mouth every 4 (four) hours as needed (for pain scale < 4).   ferrous sulfate 325 (65 FE) MG tablet Take 1 tablet (325 mg total) by mouth every other day.   ibuprofen 600 MG tablet Commonly known as: ADVIL Take 1 tablet (600 mg total) by mouth every 6 (six) hours.   loratadine 10 MG tablet Commonly known as: CLARITIN Take 10 mg by mouth daily as needed for allergies.   multivitamin-prenatal 27-0.8 MG Tabs tablet Take 1 tablet  by mouth daily at 12 noon.         Discharge home in stable condition Infant Feeding: Breast Infant Disposition:home with mother Discharge instruction: per After Visit Summary and Postpartum booklet. Activity: Advance as tolerated. Pelvic rest for 6 weeks.  Diet: routine diet Future Appointments: Future Appointments  Date Time Provider [place]  03/21/2021  9:30 AM Rasch, Artist Pais, NP [facility]   Follow up Visit: Message sent by Dr. Cy Blamer on 02/18/2021  Please schedule this patient for a In person postpartum visit in 4 weeks with the following provider: APP. Additional Postpartum F/U: None   Low risk pregnancy complicated by:  None Delivery mode:  Vaginal, Spontaneous  Anticipated Birth Control:   Considering Nexplanon vs. OCPs at 6 weeks  Renard Matter, MD, MPH OB Fellow, Faculty Practice

## 2021-02-19 ENCOUNTER — Encounter: Payer: 59 | Admitting: Obstetrics and Gynecology

## 2021-02-23 ENCOUNTER — Inpatient Hospital Stay (HOSPITAL_COMMUNITY): Admit: 2021-02-23 | Payer: Self-pay

## 2021-02-27 ENCOUNTER — Telehealth (HOSPITAL_COMMUNITY): Payer: Self-pay | Admitting: *Deleted

## 2021-02-27 NOTE — Telephone Encounter (Signed)
Attempted discharge follow-up call. Female answered the phone and reported patient unavailable at this time. Deforest Hoyles, RN, 02/27/21, [phone].

## 2021-02-28 ENCOUNTER — Encounter: Payer: 59 | Admitting: Obstetrics and Gynecology

## 2021-03-17 NOTE — Progress Notes (Signed)
Post Partum Visit Note  Lorraine Barker is a 30 y.o. G42P0000 female who presents for a postpartum visit. She is 4 weeks postpartum following a normal spontaneous vaginal delivery.  I have fully reviewed the prenatal and intrapartum course. The delivery was at 39.1 gestational weeks.  Anesthesia: epidural. Postpartum course has been unremarkable. Baby is doing well. Baby is feeding by breast. Bleeding staining only. Bowel function is normal. Bladder function is normal- occasional dysuria Patient is not sexually active. Contraception method is none. Postpartum depression screening: negative (score 6)   The pregnancy intention screening data noted above was reviewed. Potential methods of contraception were discussed   Edinburgh Postnatal Depression Scale - 03/18/21 0826       Lorraine Barker Postnatal Depression Scale:  In the Past 7 Days   I have been able to laugh and see the funny side of things. 0    I have looked forward with enjoyment to things. 0    I have blamed myself unnecessarily when things went wrong. 1    I have been anxious or worried for no good reason. 1    I have felt scared or panicky for no good reason. 1    Things have been getting on top of me. 1    I have been so unhappy that I have had difficulty sleeping. 1    I have felt sad or miserable. 1    I have been so unhappy that I have been crying. 0    The thought of harming myself has occurred to me. 0    Edinburgh Postnatal Depression Scale Total 6             Health Maintenance Due  Topic Date Due   COVID-19 Vaccine (3 - Booster for Janssen series) 09/05/2020   INFLUENZA VACCINE  Never done   PAP SMEAR-Modifier  04/12/2021    The following portions of the patient's history were reviewed and updated as appropriate: allergies, current medications, past family history, past medical history, past social history, past surgical history, and problem list.  Review of Systems Pertinent items are noted in HPI.  Objective:   BP 127/78   Pulse 86   Ht 5\' 4"  (1.626 m)   Wt 177 lb (80.3 kg)   Breastfeeding Yes   BMI 30.38 kg/m    General:  alert, cooperative, and no distress   Breasts:  normal  Lungs: clear to auscultation bilaterally  Heart:  regular rate and rhythm, S1, S2 normal, no murmur, click, rub or gallop  Abdomen: soft, non-tender; bowel sounds normal; no masses,  no organomegaly   Wound N/a  GU exam:  normal, small amount of vaginal bleeding, laceration healing appropriately, small amount of suture still visible, no pain upon palpation       Assessment:     1. Postpartum care following vaginal delivery    Normal postpartum exam.  Patient due for pap smear in October, will have patient return at that time   Plan:   Essential components of care per ACOG recommendations:  1.  Mood and well being: Patient with negative depression screening today. Reviewed local resources for support.  - Patient tobacco use? No.   - hx of drug use? No.    2. Infant care and feeding:  -Patient currently breastmilk feeding? Yes. Discussed returning to work and pumping. Reviewed importance of draining breast regularly to support lactation.  -Social determinants of health (SDOH) reviewed in EPIC. No concerns  3. Sexuality, contraception  and birth spacing - Patient does not want a pregnancy in the next year.  Desired family size is undecided at this time - Reviewed forms of contraception in tiered fashion. Patient desired no method today.   - Discussed birth spacing of 18 months  4. Sleep and fatigue -Encouraged family/partner/community support of 4 hrs of uninterrupted sleep to help with mood and fatigue  5. Physical Recovery  - Discussed patients delivery and complications. She describes her labor as good. - Patient had a Vaginal, no problems at delivery. Patient had a 2nd degree laceration. Perineal healing reviewed. Patient expressed understanding - Patient has urinary incontinence? No. - Patient is  not safe to resume physical and sexual activity, plan to wait till 6 weeks  6.  Health Maintenance - HM due items addressed Yes - Last pap smear 03/2018. Pap smear not done at today's visit.  -Breast Cancer screening indicated? No.   7. Chronic Disease/Pregnancy Condition follow up: None  - PCP follow up  Lorraine Barker, CNM [facility] for [facility], [facility]

## 2021-03-18 ENCOUNTER — Ambulatory Visit (INDEPENDENT_AMBULATORY_CARE_PROVIDER_SITE_OTHER): Payer: 59

## 2021-03-18 ENCOUNTER — Other Ambulatory Visit: Payer: Self-pay

## 2021-03-19 ENCOUNTER — Ambulatory Visit: Payer: 59 | Admitting: Obstetrics and Gynecology

## 2021-03-21 ENCOUNTER — Ambulatory Visit: Payer: 59 | Admitting: Obstetrics and Gynecology

## 2021-09-17 IMAGING — US US MFM OB COMP +14 WKS
1 series · 14 of 28 positions shown · non-contrast
Comparison: none

[Series 1: us mfm ob comp +14 wks · 115 acquisitions, 14 frames shown]
[im 5/115]
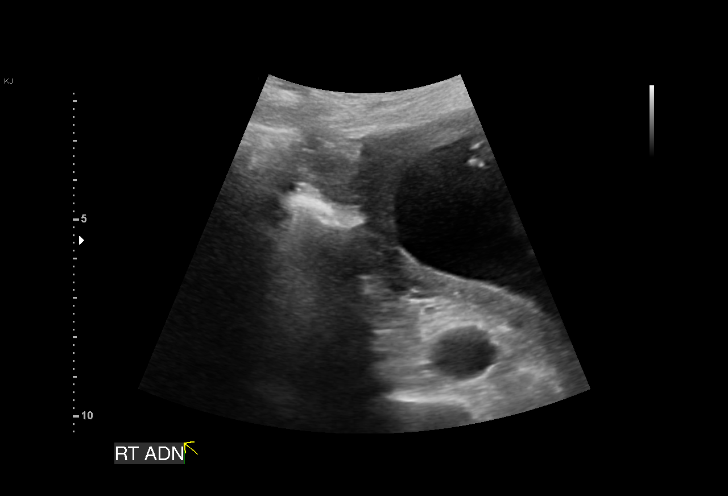
[im 13/115]
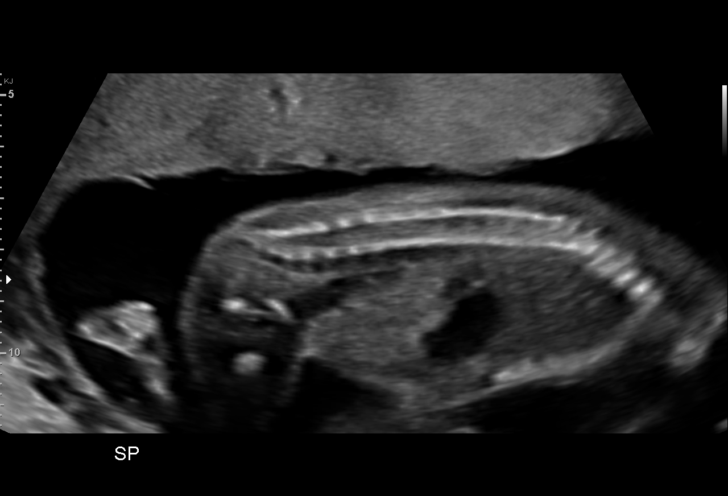
[im 22/115]
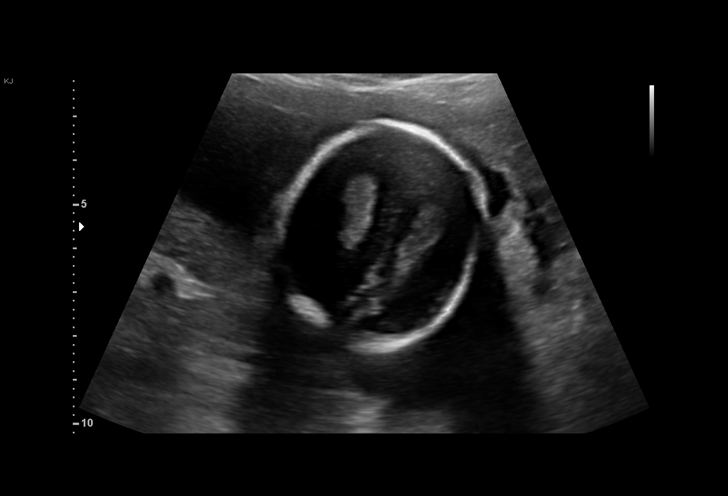
[im 30/115]
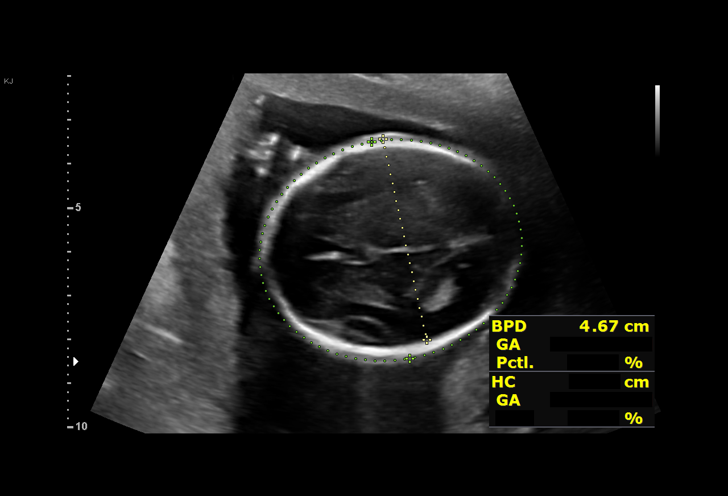
[im 39/115]
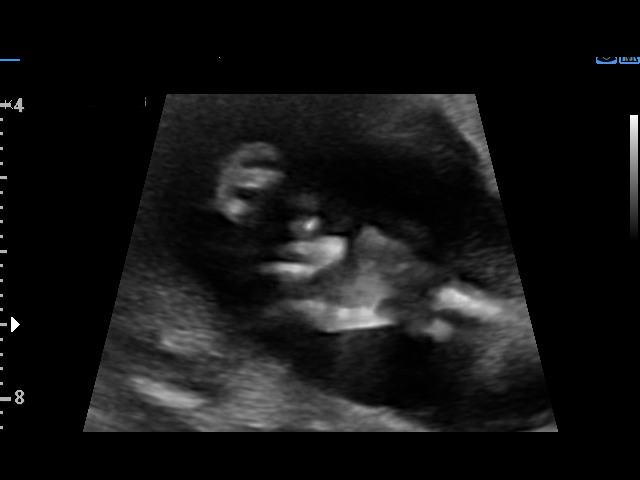
[im 47/115]
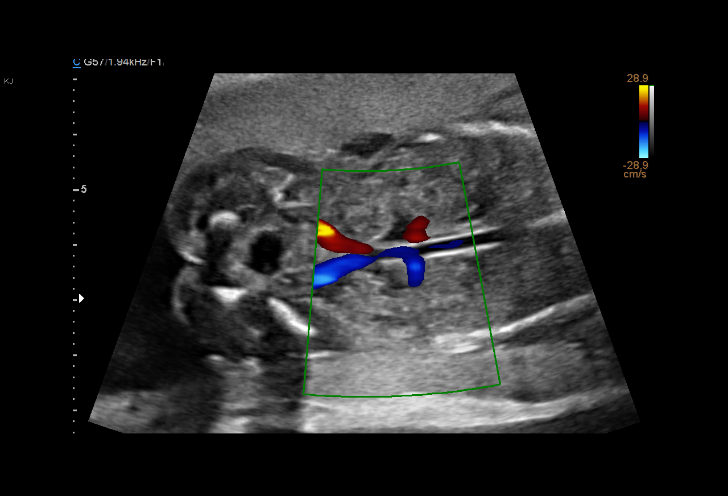
[im 55/115]
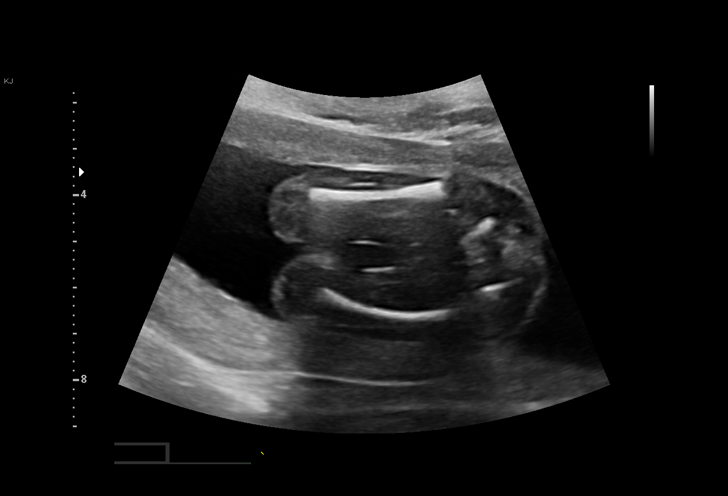
[im 64/115]
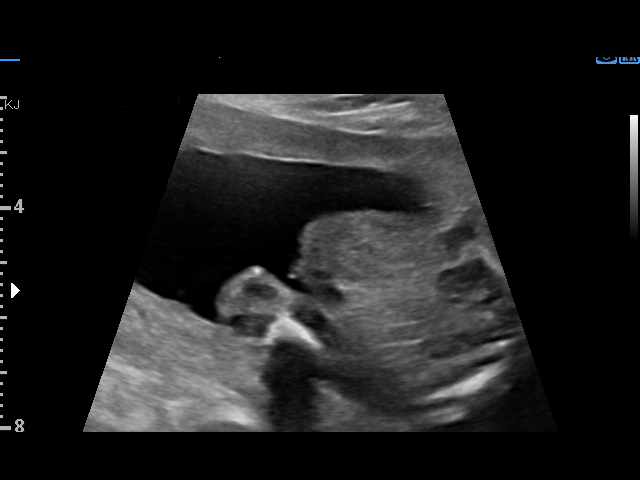
[im 72/115]
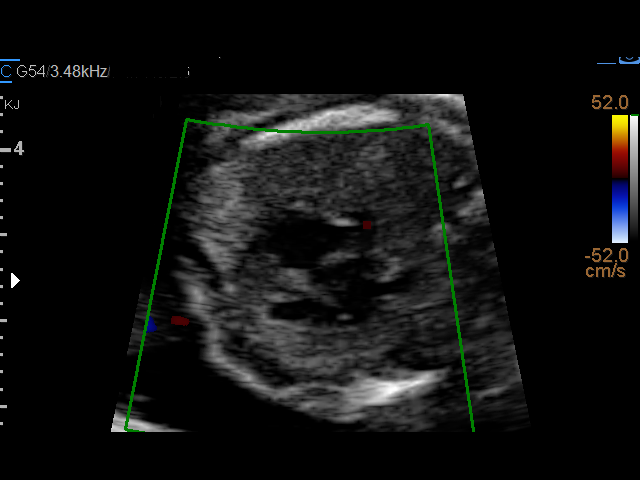
[im 81/115]
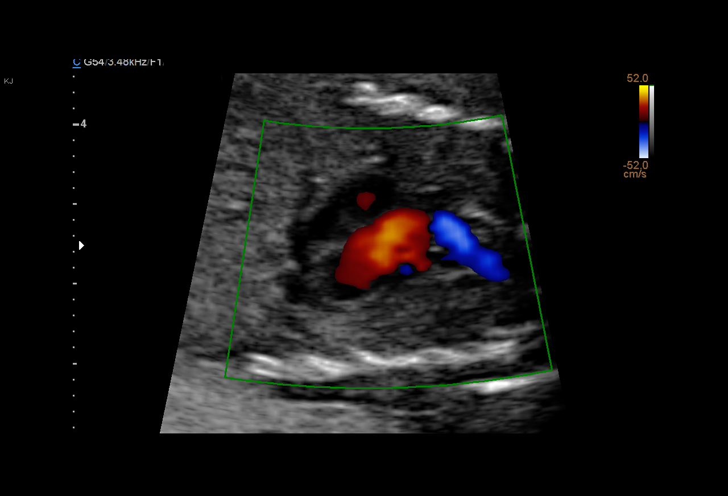
[im 89/115]
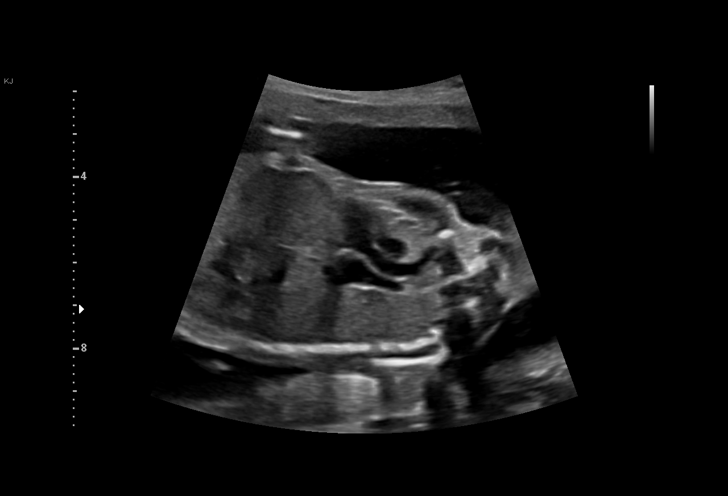
[im 98/115]
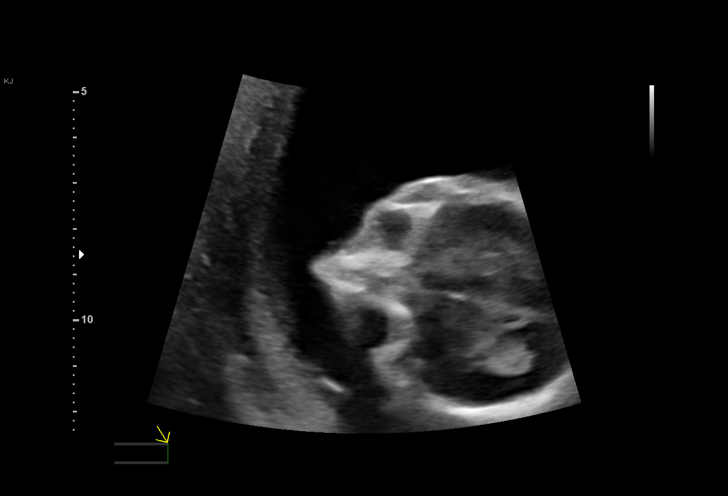
[im 106/115]
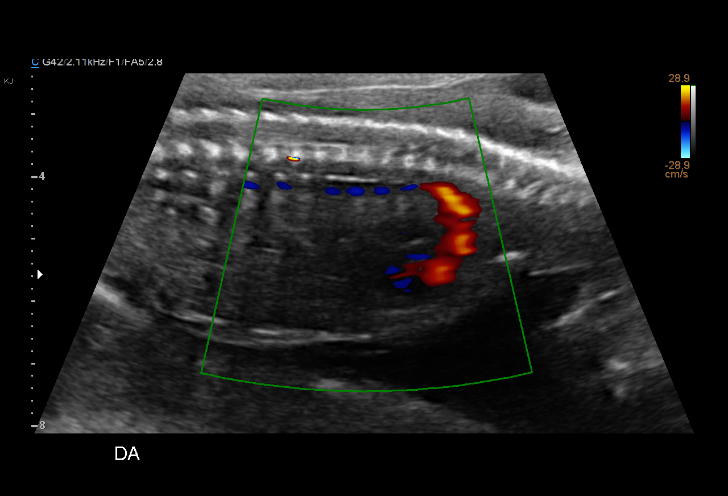
[im 115/115]
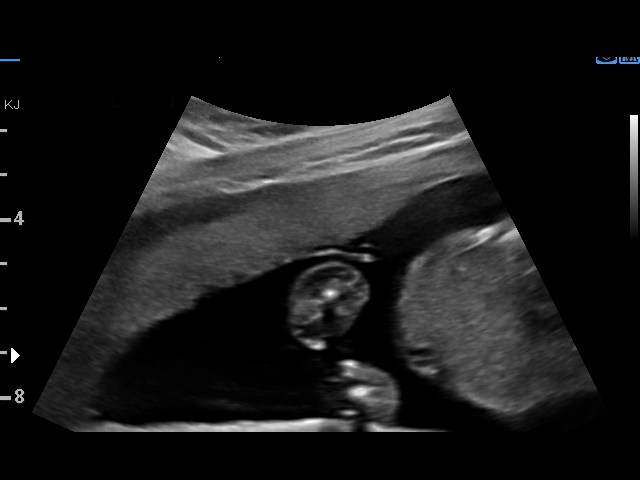

[14 of 28 positions shown; findings below may reference images not displayed]

MOSCOSO CNM

 1  US MFM OB COMP + 14 WK                76805.01    ARVAR THAN

Indications

 Encounter for antenatal screening for
 malformations
 LR NIPS, Neg AFP, Neg [DATE] Horizon
 19 weeks gestation of pregnancy
Fetal Evaluation

 Num Of Fetuses:         1
 Fetal Heart Rate(bpm):  152
 Cardiac Activity:       Observed
 Presentation:           Cephalic
 Placenta:               Anterior
 P. Cord Insertion:      Not well visualized

 Amniotic Fluid
 AFI FV:      Within normal limits
Biometry

 BPD:      47.2  mm     G. Age:  20w 2d         86  %    CI:         74.4   %    70 - 86
                                                         FL/HC:      17.0   %    16.1 -
 HC:      173.7  mm     G. Age:  19w 6d         72  %    HC/AC:      1.20        1.09 -
 AC:      144.7  mm     G. Age:  19w 6d         63  %    FL/BPD:     62.5   %
 FL:       29.5  mm     G. Age:  19w 1d         36  %    FL/AC:      20.4   %    20 - 24
 HUM:      29.8  mm     G. Age:  19w 6d         64  %
 CER:      20.9  mm     G. Age:  20w 0d         77  %
 NFT:       2.5  mm
 LV:          5  mm
 CM:        5.8  mm
 Est. FW:     298  gm    0 lb 11 oz      60  %
OB History

 Gravidity:    1
Gestational Age

 LMP:           33w 2d        Date:  02/11/20                 EDD:   11/17/20
 U/S Today:     19w 6d                                        EDD:   02/19/21
 Best:          19w 2d     Det. By:  Early Ultrasound         EDD:   02/23/21
                                     (07/01/20)
Anatomy

 Cranium:               Appears normal         Aortic Arch:            Appears normal
 Cavum:                 Appears normal         Ductal Arch:            Appears normal
 Ventricles:            Appears normal         Diaphragm:              Appears normal
 Choroid Plexus:        Appears normal         Stomach:                Appears normal, left
                                                                       sided
 Cerebellum:            Appears normal         Abdomen:                Appears normal
 Posterior Fossa:       Appears normal         Abdominal Wall:         Appears nml (cord
                                                                       insert, abd wall)
 Nuchal Fold:           Appears normal         Cord Vessels:           Appears normal (3
                                                                       vessel cord)
 Face:                  Appears normal         Kidneys:                Appear normal
                        (orbits and profile)
 Lips:                  Appears normal         Bladder:                Appears normal
 Thoracic:              Appears normal         Spine:                  Appears normal
 Heart:                 Appears normal         Upper Extremities:      Appears normal
                        (4CH, axis, and
                        situs)
 RVOT:                  Appears normal         Lower Extremities:      Appears normal
 LVOT:                  Appears normal

 Other:  Fetus appears to be a male. Heels and 5th digit visualized. Open
         hands visualized. Nasal bone visualized. VC, 3VV and 3VTV
         visualized.
Cervix Uterus Adnexa

 Cervix
 Length:           3.43  cm.
 Normal appearance by transabdominal scan.

 Right Ovary
 Not visualized.

 Left Ovary
 Within normal limits.

 Adnexa
 No abnormality visualized.
Impression

 Single intrauterine pregnancy here for an anatomy exam
 Normal anatomy with measurements consistent with dates
 There is good fetal movement and amniotic fluid volume
 She has a low risk NIPS, neg horizon and AFP.
Recommendations

 Follow up growth as clinically indicated.

## 2021-10-22 ENCOUNTER — Encounter: Payer: Self-pay | Admitting: Obstetrics and Gynecology

## 2021-10-22 NOTE — Progress Notes (Signed)
? ?  GYNECOLOGY OFFICE VISIT NOTE ? ?History:  ? Aneeka Barker is a 31 y.o. G1P1001 here today for concern for bartholins cyst. It is in her labia and started on Tuesday for sure when she was driving home. No drainage from it. No fevers. Notes she tried to express it herself but too painful and no head to it.  ? ?Historically she had a 2nd degree lac with her son in 01/2021.  ? ?She denies any abnormal vaginal discharge, bleeding, pelvic pain or other concerns. ? ? ?  ?Past Medical History:  ?Diagnosis Date  ? Kidney stone   ? Ovarian cyst   ? ? ?Past Surgical History:  ?Procedure Laterality Date  ? APPENDECTOMY    ? WISDOM TOOTH EXTRACTION    ? ? ?The following portions of the patient's history were reviewed and updated as appropriate: allergies, current medications, past family history, past medical history, past social history, past surgical history and problem list.  ? ?Health Maintenance:   ?Normal pap 03/2018 ? ? ?Review of Systems:  ?Pertinent items noted in HPI and remainder of comprehensive ROS otherwise negative. ? ?Physical Exam:  ?BP 128/80   Pulse 85   Temp 98.5 ?F (36.9 ?C)   Ht 5\' 4"  (1.626 m)   Wt 191 lb (86.6 kg)   Breastfeeding Yes   BMI 32.79 kg/m?  ?CONSTITUTIONAL: Well-developed, well-nourished female in no acute distress.  ?HEENT:  Normocephalic, atraumatic. External right and left ear normal. No scleral icterus.  ?NECK: Normal range of motion, supple, no masses noted on observation ?SKIN: No rash noted. Not diaphoretic. No erythema. No pallor. ?MUSCULOSKELETAL: Normal range of motion. No edema noted. ?NEUROLOGIC: Alert and oriented to person, place, and time. Normal muscle tone coordination. No cranial nerve deficit noted. ?PSYCHIATRIC: Normal mood and affect. Normal behavior. Normal judgment and thought content. ? ?CARDIOVASCULAR: Normal heart rate noted ?RESPIRATORY: Effort and breath sounds normal, no problems with respiration noted ?ABDOMEN: No masses noted. No other overt distention  noted.   ? ?PELVIC:  1 cm tender round well circumscribed likely abscess in the right labia minora ? ? ?I&D NOTE ?The indications for I&D  were reviewed - abscess.   Risks include pain, bleeding, infection, scarring and need for additional procedures were discussed. The patient stated understanding and agreed to undergo procedure today. Consent was signed, time out performed.  ? ?The patient's skin over the abscess was prepped with Betadine. Skin numbed with 1% lidocaine for 1 cc. A stab incision was made with an 11 blade scalpel.  The pus was drained.  The patient tolerated the procedure well.  ? ?Post-procedure instructions and ABX were given to the patient. The patient is to call with heavy bleeding, fever greater than 100.4, foul smelling vaginal discharge or other concerns.  ? ? ?Assessment and Plan:  ? 1. Vulvar lesion ?- I&D done at pt request (as opposed to ABX alone) ?- Care instructions reviewed I.e. neosporin ?- Will do bactrim x7d for complete resolution ?-     sulfamethoxazole-trimethoprim (BACTRIM DS) 800-160 MG tablet; Take 1 tablet by mouth 2 (two) times daily. ? ? ? ? ?Schedule for annual  ?Routine preventative health maintenance measures emphasized. ?Please refer to After Visit Summary for other counseling recommendations.  ? ?Return if symptoms worsen or fail to improve. ? ? , MD, FACOG ?Obstetrician [facility], Faculty Practice ?[facility], [facility] ? ? ? ? ? ?

## 2021-10-23 ENCOUNTER — Ambulatory Visit: Payer: BC Managed Care – PPO | Admitting: Obstetrics and Gynecology

## 2021-10-23 ENCOUNTER — Encounter: Payer: Self-pay | Admitting: Obstetrics and Gynecology

## 2021-10-23 VITALS — BP 128/80 | HR 85 | Temp 98.5°F | Ht 64.0 in | Wt 191.0 lb

## 2021-10-23 DIAGNOSIS — N9089 Other specified noninflammatory disorders of vulva and perineum: Secondary | ICD-10-CM

## 2021-10-23 DIAGNOSIS — N764 Abscess of vulva: Secondary | ICD-10-CM | POA: Diagnosis not present

## 2021-10-23 MED ORDER — SULFAMETHOXAZOLE-TRIMETHOPRIM 800-160 MG PO TABS: 1.0000 | ORAL_TABLET | Freq: Two times a day (BID) | ORAL | 1 refills | Status: DC

## 2022-06-08 DIAGNOSIS — J069 Acute upper respiratory infection, unspecified: Secondary | ICD-10-CM | POA: Diagnosis not present

## 2022-08-21 DIAGNOSIS — Z13220 Encounter for screening for lipoid disorders: Secondary | ICD-10-CM | POA: Diagnosis not present

## 2022-08-21 DIAGNOSIS — Z0000 Encounter for general adult medical examination without abnormal findings: Secondary | ICD-10-CM | POA: Diagnosis not present

## 2022-12-23 DIAGNOSIS — F4322 Adjustment disorder with anxiety: Secondary | ICD-10-CM | POA: Diagnosis not present

## 2022-12-28 DIAGNOSIS — F4322 Adjustment disorder with anxiety: Secondary | ICD-10-CM | POA: Diagnosis not present

## 2022-12-29 DIAGNOSIS — F4322 Adjustment disorder with anxiety: Secondary | ICD-10-CM | POA: Diagnosis not present

## 2023-01-05 DIAGNOSIS — F4322 Adjustment disorder with anxiety: Secondary | ICD-10-CM | POA: Diagnosis not present

## 2023-01-26 DIAGNOSIS — F411 Generalized anxiety disorder: Secondary | ICD-10-CM | POA: Diagnosis not present

## 2023-02-25 DIAGNOSIS — F411 Generalized anxiety disorder: Secondary | ICD-10-CM | POA: Diagnosis not present

## 2023-04-12 DIAGNOSIS — F411 Generalized anxiety disorder: Secondary | ICD-10-CM | POA: Diagnosis not present

## 2023-05-17 DIAGNOSIS — F411 Generalized anxiety disorder: Secondary | ICD-10-CM | POA: Diagnosis not present

## 2023-06-27 DIAGNOSIS — R300 Dysuria: Secondary | ICD-10-CM | POA: Diagnosis not present

## 2023-06-27 DIAGNOSIS — N200 Calculus of kidney: Secondary | ICD-10-CM | POA: Diagnosis not present

## 2023-06-27 DIAGNOSIS — R112 Nausea with vomiting, unspecified: Secondary | ICD-10-CM | POA: Diagnosis not present

## 2023-06-27 DIAGNOSIS — N23 Unspecified renal colic: Secondary | ICD-10-CM | POA: Diagnosis not present

## 2023-06-27 DIAGNOSIS — K5730 Diverticulosis of large intestine without perforation or abscess without bleeding: Secondary | ICD-10-CM | POA: Diagnosis not present

## 2023-06-27 DIAGNOSIS — R109 Unspecified abdominal pain: Secondary | ICD-10-CM | POA: Diagnosis not present

## 2023-06-27 DIAGNOSIS — N132 Hydronephrosis with renal and ureteral calculous obstruction: Secondary | ICD-10-CM | POA: Diagnosis not present

## 2023-06-27 DIAGNOSIS — N134 Hydroureter: Secondary | ICD-10-CM | POA: Diagnosis not present

## 2023-06-27 DIAGNOSIS — N202 Calculus of kidney with calculus of ureter: Secondary | ICD-10-CM | POA: Diagnosis not present

## 2023-08-23 ENCOUNTER — Encounter (HOSPITAL_COMMUNITY)
Admission: EM | Disposition: A | Discharge status: Home / Self Care | Payer: Self-pay | Source: Home / Self Care | Attending: Internal Medicine

## 2023-08-23 ENCOUNTER — Encounter (HOSPITAL_COMMUNITY): Payer: Self-pay | Admitting: Anesthesiology

## 2023-08-23 ENCOUNTER — Emergency Department (HOSPITAL_BASED_OUTPATIENT_CLINIC_OR_DEPARTMENT_OTHER): Payer: BC Managed Care – PPO

## 2023-08-23 ENCOUNTER — Inpatient Hospital Stay (HOSPITAL_COMMUNITY): Payer: BC Managed Care – PPO | Admitting: Certified Registered Nurse Anesthetist

## 2023-08-23 ENCOUNTER — Inpatient Hospital Stay: Admit: 2023-08-23 | Payer: BC Managed Care – PPO | Admitting: Urology

## 2023-08-23 ENCOUNTER — Inpatient Hospital Stay (HOSPITAL_COMMUNITY): Payer: BC Managed Care – PPO

## 2023-08-23 ENCOUNTER — Encounter (HOSPITAL_BASED_OUTPATIENT_CLINIC_OR_DEPARTMENT_OTHER): Payer: Self-pay | Admitting: Radiology

## 2023-08-23 ENCOUNTER — Inpatient Hospital Stay (HOSPITAL_BASED_OUTPATIENT_CLINIC_OR_DEPARTMENT_OTHER)
Admission: EM | Admit: 2023-08-23 | Discharge: 2023-08-25 | DRG: 854 | Disposition: A | Discharge status: Home / Self Care | Payer: BC Managed Care – PPO | Attending: Family Medicine | Admitting: Family Medicine

## 2023-08-23 ENCOUNTER — Other Ambulatory Visit: Payer: Self-pay

## 2023-08-23 DIAGNOSIS — Z79899 Other long term (current) drug therapy: Secondary | ICD-10-CM | POA: Diagnosis not present

## 2023-08-23 DIAGNOSIS — R109 Unspecified abdominal pain: Secondary | ICD-10-CM | POA: Diagnosis not present

## 2023-08-23 DIAGNOSIS — Z1152 Encounter for screening for COVID-19: Secondary | ICD-10-CM | POA: Diagnosis not present

## 2023-08-23 DIAGNOSIS — E871 Hypo-osmolality and hyponatremia: Secondary | ICD-10-CM | POA: Diagnosis not present

## 2023-08-23 DIAGNOSIS — D72829 Elevated white blood cell count, unspecified: Secondary | ICD-10-CM | POA: Diagnosis present

## 2023-08-23 DIAGNOSIS — Z87442 Personal history of urinary calculi: Secondary | ICD-10-CM | POA: Diagnosis not present

## 2023-08-23 DIAGNOSIS — Z8249 Family history of ischemic heart disease and other diseases of the circulatory system: Secondary | ICD-10-CM | POA: Diagnosis not present

## 2023-08-23 DIAGNOSIS — N134 Hydroureter: Secondary | ICD-10-CM | POA: Diagnosis not present

## 2023-08-23 DIAGNOSIS — E876 Hypokalemia: Secondary | ICD-10-CM | POA: Diagnosis not present

## 2023-08-23 DIAGNOSIS — N136 Pyonephrosis: Secondary | ICD-10-CM | POA: Diagnosis present

## 2023-08-23 DIAGNOSIS — R319 Hematuria, unspecified: Secondary | ICD-10-CM | POA: Diagnosis not present

## 2023-08-23 DIAGNOSIS — A4150 Gram-negative sepsis, unspecified: Secondary | ICD-10-CM | POA: Diagnosis not present

## 2023-08-23 DIAGNOSIS — A419 Sepsis, unspecified organism: Secondary | ICD-10-CM | POA: Diagnosis present

## 2023-08-23 DIAGNOSIS — B964 Proteus (mirabilis) (morganii) as the cause of diseases classified elsewhere: Secondary | ICD-10-CM | POA: Diagnosis present

## 2023-08-23 DIAGNOSIS — N132 Hydronephrosis with renal and ureteral calculous obstruction: Secondary | ICD-10-CM | POA: Diagnosis not present

## 2023-08-23 DIAGNOSIS — F32A Depression, unspecified: Secondary | ICD-10-CM | POA: Diagnosis present

## 2023-08-23 DIAGNOSIS — R000 Tachycardia, unspecified: Secondary | ICD-10-CM | POA: Diagnosis not present

## 2023-08-23 DIAGNOSIS — N390 Urinary tract infection, site not specified: Secondary | ICD-10-CM

## 2023-08-23 DIAGNOSIS — N201 Calculus of ureter: Secondary | ICD-10-CM | POA: Diagnosis not present

## 2023-08-23 DIAGNOSIS — N12 Tubulo-interstitial nephritis, not specified as acute or chronic: Secondary | ICD-10-CM | POA: Diagnosis present

## 2023-08-23 DIAGNOSIS — R6510 Systemic inflammatory response syndrome (SIRS) of non-infectious origin without acute organ dysfunction: Secondary | ICD-10-CM | POA: Diagnosis not present

## 2023-08-23 DIAGNOSIS — N838 Other noninflammatory disorders of ovary, fallopian tube and broad ligament: Secondary | ICD-10-CM | POA: Diagnosis not present

## 2023-08-23 DIAGNOSIS — D62 Acute posthemorrhagic anemia: Secondary | ICD-10-CM | POA: Diagnosis not present

## 2023-08-23 DIAGNOSIS — D649 Anemia, unspecified: Secondary | ICD-10-CM | POA: Diagnosis not present

## 2023-08-23 HISTORY — PX: CYSTOSCOPY W/ URETERAL STENT PLACEMENT: SHX1429

## 2023-08-23 LAB — COMPREHENSIVE METABOLIC PANEL
ALT: 6 U/L (ref 0–44)
AST: 12 U/L — ABNORMAL LOW (ref 15–41)
Albumin: 4.2 g/dL (ref 3.5–5.0)
Alkaline Phosphatase: 40 U/L (ref 38–126)
Anion gap: 9 (ref 5–15)
BUN: 10 mg/dL (ref 6–20)
CO2: 24 mmol/L (ref 22–32)
Calcium: 8.7 mg/dL — ABNORMAL LOW (ref 8.9–10.3)
Chloride: 101 mmol/L (ref 98–111)
Creatinine, Ser: 0.93 mg/dL (ref 0.44–1.00)
GFR, Estimated: >60 mL/min (ref >60)
Glucose, Bld: 130 mg/dL — ABNORMAL HIGH (ref 70–99)
Potassium: 3.5 mmol/L (ref 3.5–5.1)
Sodium: 134 mmol/L — ABNORMAL LOW (ref 135–145)
Total Bilirubin: 0.7 mg/dL (ref 0.0–1.2)
Total Protein: 7.3 g/dL (ref 6.5–8.1)

## 2023-08-23 LAB — CBC WITH DIFFERENTIAL/PLATELET
Abs Immature Granulocytes: 0.07 10*3/uL (ref 0.00–0.07)
Basophils Absolute: 0 10*3/uL (ref 0.0–0.1)
Basophils Relative: 0 %
Eosinophils Absolute: 0.1 10*3/uL (ref 0.0–0.5)
Eosinophils Relative: 0 %
HCT: 33.3 % — ABNORMAL LOW (ref 36.0–46.0)
Hemoglobin: 10.5 g/dL — ABNORMAL LOW (ref 12.0–15.0)
Immature Granulocytes: 1 %
Lymphocytes Relative: 5 %
Lymphs Abs: 0.7 10*3/uL (ref 0.7–4.0)
MCH: 26.1 pg (ref 26.0–34.0)
MCHC: 31.5 g/dL (ref 30.0–36.0)
MCV: 82.6 fL (ref 80.0–100.0)
Monocytes Absolute: 1.2 10*3/uL — ABNORMAL HIGH (ref 0.1–1.0)
Monocytes Relative: 8 %
Neutro Abs: 13.2 10*3/uL — ABNORMAL HIGH (ref 1.7–7.7)
Neutrophils Relative %: 86 %
Platelets: 203 10*3/uL (ref 150–400)
RBC: 4.03 MIL/uL (ref 3.87–5.11)
RDW: 13.8 % (ref 11.5–15.5)
WBC: 15.3 10*3/uL — ABNORMAL HIGH (ref 4.0–10.5)
nRBC: 0 % (ref 0.0–0.2)

## 2023-08-23 LAB — URINALYSIS, W/ REFLEX TO CULTURE (INFECTION SUSPECTED)
Bilirubin Urine: NEGATIVE
Glucose, UA: NEGATIVE mg/dL
Nitrite: NEGATIVE
Protein, ur: 30 mg/dL — AB
Specific Gravity, Urine: 1.017 (ref 1.005–1.030)
WBC, UA: >50 WBC/hpf (ref 0–5)
pH: 6.5 (ref 5.0–8.0)

## 2023-08-23 LAB — RESP PANEL BY RT-PCR (RSV, FLU A&B, COVID)  RVPGX2
Influenza A by PCR: NEGATIVE
Influenza B by PCR: NEGATIVE
Resp Syncytial Virus by PCR: NEGATIVE
SARS Coronavirus 2 by RT PCR: NEGATIVE

## 2023-08-23 LAB — LACTIC ACID, PLASMA: Lactic Acid, Venous: 0.8 mmol/L (ref 0.5–1.9)

## 2023-08-23 LAB — PREGNANCY, URINE: Preg Test, Ur: NEGATIVE

## 2023-08-23 SURGERY — CYSTOSCOPY, WITH RETROGRADE PYELOGRAM AND URETERAL STENT INSERTION
Anesthesia: General | Site: Ureter | Laterality: Right

## 2023-08-23 SURGERY — CYSTOURETEROSCOPY, WITH RETROGRADE PYELOGRAM AND STENT INSERTION
Anesthesia: General | Laterality: Right

## 2023-08-23 MED ORDER — MELATONIN 3 MG PO TABS: 3.0000 mg | ORAL_TABLET | Freq: Every evening | ORAL | Status: DC | PRN

## 2023-08-23 MED ORDER — LACTATED RINGERS IV BOLUS (SEPSIS)
1000.0000 mL | Freq: Once | INTRAVENOUS | Status: DC
Start: 2023-08-23 — End: 2023-08-23

## 2023-08-23 MED ORDER — SODIUM CHLORIDE 0.9 % IV BOLUS
1000.0000 mL | Freq: Once | INTRAVENOUS | Status: AC
  Administered 2023-08-23: 1000 mL via INTRAVENOUS

## 2023-08-23 MED ORDER — ONDANSETRON HCL 4 MG/2ML IJ SOLN: 4.0000 mg | Freq: Once | INTRAMUSCULAR | Status: DC | PRN

## 2023-08-23 MED ORDER — LIDOCAINE 2% (20 MG/ML) 5 ML SYRINGE
INTRAMUSCULAR | Status: DC | PRN
  Administered 2023-08-23: 80 mg via INTRAVENOUS

## 2023-08-23 MED ORDER — MIDAZOLAM HCL 2 MG/2ML IJ SOLN
INTRAMUSCULAR | Status: DC | PRN
  Administered 2023-08-23: 2 mg via INTRAVENOUS

## 2023-08-23 MED ORDER — FENTANYL CITRATE (PF) 250 MCG/5ML IJ SOLN
INTRAMUSCULAR | Status: AC
  Filled 2023-08-23: qty 5

## 2023-08-23 MED ORDER — KETOROLAC TROMETHAMINE 15 MG/ML IJ SOLN
15.0000 mg | Freq: Four times a day (QID) | INTRAMUSCULAR | Status: DC | PRN
Start: 2023-08-23 — End: 2023-08-27

## 2023-08-23 MED ORDER — FENTANYL CITRATE (PF) 100 MCG/2ML IJ SOLN: 25.0000 ug | INTRAMUSCULAR | Status: DC | PRN

## 2023-08-23 MED ORDER — SODIUM CHLORIDE 0.9 % IV SOLN
1.0000 g | Freq: Once | INTRAVENOUS | Status: AC
  Administered 2023-08-23: 1 g via INTRAVENOUS
  Filled 2023-08-23: qty 10

## 2023-08-23 MED ORDER — LACTATED RINGERS IV BOLUS (SEPSIS): 1000.0000 mL | Freq: Once | INTRAVENOUS | Status: DC

## 2023-08-23 MED ORDER — KETOROLAC TROMETHAMINE 15 MG/ML IJ SOLN
15.0000 mg | Freq: Once | INTRAMUSCULAR | Status: AC
  Administered 2023-08-23: 15 mg via INTRAVENOUS
  Filled 2023-08-23: qty 1

## 2023-08-23 MED ORDER — ONDANSETRON HCL 4 MG/2ML IJ SOLN
4.0000 mg | Freq: Once | INTRAMUSCULAR | Status: AC
  Administered 2023-08-23: 4 mg via INTRAVENOUS
  Filled 2023-08-23: qty 2

## 2023-08-23 MED ORDER — IOHEXOL 300 MG/ML  SOLN
INTRAMUSCULAR | Status: DC | PRN
  Administered 2023-08-23: 5 mL via URETHRAL

## 2023-08-23 MED ORDER — KETOROLAC TROMETHAMINE 30 MG/ML IJ SOLN
INTRAMUSCULAR | Status: DC | PRN
  Administered 2023-08-23: 30 mg via INTRAVENOUS

## 2023-08-23 MED ORDER — LACTATED RINGERS IV SOLN: INTRAVENOUS | Status: DC

## 2023-08-23 MED ORDER — ONDANSETRON HCL 4 MG/2ML IJ SOLN
INTRAMUSCULAR | Status: DC | PRN
  Administered 2023-08-23: 4 mg via INTRAVENOUS

## 2023-08-23 MED ORDER — NALOXONE HCL 0.4 MG/ML IJ SOLN: 0.4000 mg | INTRAMUSCULAR | Status: DC | PRN

## 2023-08-23 MED ORDER — SODIUM CHLORIDE 0.9 % IV SOLN
1.0000 g | INTRAVENOUS | Status: DC
  Administered 2023-08-24 – 2023-08-25 (×2): 1 g via INTRAVENOUS
  Filled 2023-08-23 (×2): qty 10

## 2023-08-23 MED ORDER — MIDAZOLAM HCL 2 MG/2ML IJ SOLN
INTRAMUSCULAR | Status: AC
  Filled 2023-08-23: qty 2

## 2023-08-23 MED ORDER — SODIUM CHLORIDE 0.9 % IV SOLN: 2.0000 g | Freq: Once | INTRAVENOUS | Status: DC

## 2023-08-23 MED ORDER — 0.9 % SODIUM CHLORIDE (POUR BTL) OPTIME
TOPICAL | Status: DC | PRN
  Administered 2023-08-23: 1000 mL

## 2023-08-23 MED ORDER — FENTANYL CITRATE PF 50 MCG/ML IJ SOSY: 50.0000 ug | PREFILLED_SYRINGE | INTRAMUSCULAR | Status: DC | PRN

## 2023-08-23 MED ORDER — PROPOFOL 10 MG/ML IV BOLUS
INTRAVENOUS | Status: AC
  Filled 2023-08-23: qty 20

## 2023-08-23 MED ORDER — FENTANYL CITRATE (PF) 250 MCG/5ML IJ SOLN
INTRAMUSCULAR | Status: DC | PRN
Start: 2023-08-23 — End: 2023-08-23
  Administered 2023-08-23: 50 ug via INTRAVENOUS

## 2023-08-23 MED ORDER — PROPOFOL 10 MG/ML IV BOLUS
INTRAVENOUS | Status: DC | PRN
  Administered 2023-08-23: 170 mg via INTRAVENOUS

## 2023-08-23 MED ORDER — ACETAMINOPHEN 10 MG/ML IV SOLN
INTRAVENOUS | Status: DC | PRN
  Administered 2023-08-23: 1000 mg via INTRAVENOUS

## 2023-08-23 MED ORDER — LACTATED RINGERS IV SOLN: INTRAVENOUS | Status: DC | PRN

## 2023-08-23 MED ORDER — DEXAMETHASONE SODIUM PHOSPHATE 10 MG/ML IJ SOLN
INTRAMUSCULAR | Status: DC | PRN
Start: 2023-08-23 — End: 2023-08-23
  Administered 2023-08-23: 5 mg via INTRAVENOUS

## 2023-08-23 MED ORDER — WATER FOR IRRIGATION, STERILE IR SOLN
Status: DC | PRN
  Administered 2023-08-23: 3000 mL

## 2023-08-23 MED ORDER — ACETAMINOPHEN 325 MG PO TABS: 650.0000 mg | ORAL_TABLET | Freq: Four times a day (QID) | ORAL | Status: DC | PRN

## 2023-08-23 MED ORDER — AMISULPRIDE (ANTIEMETIC) 5 MG/2ML IV SOLN: 10.0000 mg | Freq: Once | INTRAVENOUS | Status: DC | PRN

## 2023-08-23 MED ORDER — ACETAMINOPHEN 650 MG RE SUPP: 650.0000 mg | Freq: Four times a day (QID) | RECTAL | Status: DC | PRN

## 2023-08-23 MED ORDER — SODIUM CHLORIDE 0.9 % IV SOLN
1.0000 g | Freq: Once | INTRAVENOUS | Status: DC
  Administered 2023-08-23: 1 g via INTRAVENOUS
  Filled 2023-08-23: qty 10

## 2023-08-23 MED ORDER — IOHEXOL 300 MG/ML  SOLN
100.0000 mL | Freq: Once | INTRAMUSCULAR | Status: AC | PRN
  Administered 2023-08-23: 80 mL via INTRAVENOUS

## 2023-08-23 MED ORDER — PHENYLEPHRINE 80 MCG/ML (10ML) SYRINGE FOR IV PUSH (FOR BLOOD PRESSURE SUPPORT)
PREFILLED_SYRINGE | INTRAVENOUS | Status: DC | PRN
Start: 2023-08-23 — End: 2023-08-23
  Administered 2023-08-23: 80 ug via INTRAVENOUS
  Administered 2023-08-23 (×3): 160 ug via INTRAVENOUS

## 2023-08-23 MED ORDER — ONDANSETRON HCL 4 MG/2ML IJ SOLN: 4.0000 mg | Freq: Four times a day (QID) | INTRAMUSCULAR | Status: DC | PRN

## 2023-08-23 MED ORDER — ESCITALOPRAM OXALATE 10 MG PO TABS
5.0000 mg | ORAL_TABLET | Freq: Every day | ORAL | Status: DC
  Administered 2023-08-24 – 2023-08-25 (×2): 5 mg via ORAL
  Filled 2023-08-23 (×2): qty 1

## 2023-08-23 MED ORDER — TAMSULOSIN HCL 0.4 MG PO CAPS: 0.4000 mg | ORAL_CAPSULE | Freq: Every day | ORAL | Status: DC

## 2023-08-23 SURGICAL SUPPLY — 20 items
BAG URINE DRAIN 2000ML AR STRL (UROLOGICAL SUPPLIES) ×1 IMPLANT
BAG URO CATCHER STRL LF (MISCELLANEOUS) ×1 IMPLANT
CATH FOLEY 2WAY SLVR 5CC 16FR (CATHETERS) IMPLANT
CATH URETL OPEN 5X70 (CATHETERS) IMPLANT
CATH URETL OPEN END 6FR 70 (CATHETERS) ×1 IMPLANT
GLOVE BIO SURGEON STRL SZ8 (GLOVE) ×1 IMPLANT
GOWN STRL REUS W/ TWL LRG LVL3 (GOWN DISPOSABLE) ×1 IMPLANT
GOWN STRL REUS W/ TWL XL LVL3 (GOWN DISPOSABLE) ×1 IMPLANT
GUIDEWIRE ANG ZIPWIRE 038X150 (WIRE) IMPLANT
GUIDEWIRE STR DUAL SENSOR (WIRE) ×1 IMPLANT
KIT TURNOVER KIT B (KITS) ×1 IMPLANT
MANIFOLD NEPTUNE II (INSTRUMENTS) IMPLANT
NS IRRIG 1000ML POUR BTL (IV SOLUTION) IMPLANT
PACK CYSTO (CUSTOM PROCEDURE TRAY) ×1 IMPLANT
STENT URET 6FRX24 CONTOUR (STENTS) IMPLANT
STENT URET 6FRX26 CONTOUR (STENTS) IMPLANT
SYPHON OMNI JUG (MISCELLANEOUS) ×1 IMPLANT
TOWEL GREEN STERILE FF (TOWEL DISPOSABLE) ×1 IMPLANT
TUBE CONNECTING 12X1/4 (SUCTIONS) IMPLANT
WATER STERILE IRR 3000ML UROMA (IV SOLUTION) ×2 IMPLANT

## 2023-08-23 NOTE — Anesthesia Preprocedure Evaluation (Addendum)
 Anesthesia Evaluation  Patient identified by MRN, date of birth, ID band Patient awake    Reviewed: Allergy & Precautions, NPO status , Patient's Chart, lab work & pertinent test results  Airway Mallampati: I  TM Distance: >3 FB Neck ROM: Full    Dental  (+) Teeth Intact, Dental Advisory Given   Pulmonary neg pulmonary ROS   Pulmonary exam normal breath sounds clear to auscultation       Cardiovascular negative cardio ROS  Rhythm:Regular Rate:Tachycardia     Neuro/Psych negative neurological ROS     GI/Hepatic negative GI ROS, Neg liver ROS,,,  Endo/Other  negative endocrine ROS    Renal/GU Renal disease (Right Ureteral Stone with Sepsis)     Musculoskeletal negative musculoskeletal ROS (+)    Abdominal   Peds  Hematology  (+) Blood dyscrasia, anemia   Anesthesia Other Findings   Reproductive/Obstetrics                             Anesthesia Physical Anesthesia Plan  ASA: 2 and emergent  Anesthesia Plan: General   Post-op Pain Management: Ofirmev IV (intra-op)*   Induction: Intravenous, Cricoid pressure planned and Rapid sequence  PONV Risk Score and Plan: 4 or greater and Dexamethasone and Ondansetron  Airway Management Planned: LMA  Additional Equipment:   Intra-op Plan:   Post-operative Plan: Extubation in OR  Informed Consent: I have reviewed the patients History and Physical, chart, labs and discussed the procedure including the risks, benefits and alternatives for the proposed anesthesia with the patient or authorized representative who has indicated his/her understanding and acceptance.     Dental advisory given  Plan Discussed with: CRNA  Anesthesia Plan Comments:         Anesthesia Quick Evaluation

## 2023-08-23 NOTE — H&P (Incomplete)
 History and Physical      Lorraine Barker JXB:[id number] DOB: 12-15-1990 DOA: 08/23/2023; DOS: 08/23/2023  PCP: Exie Parody *** Patient coming from: home ***  I have personally briefly reviewed patient's old medical records in [facility]  Chief Complaint: ***  HPI: Lorraine Barker is a 33 y.o. female with medical history significant for *** who is admitted to [facility] on 08/23/2023 with *** after presenting from home*** to [facility] ED complaining of ***.    ***       ***   ED Course:  Vital signs in the ED were notable for the following: ***  Labs were notable for the following: ***  Per my interpretation, EKG in ED demonstrated the following:  ***  Imaging in the ED, per corresponding formal radiology read, was notable for the following:  ***  While in the ED, the following were administered: ***  Subsequently, the patient was admitted  ***  ***red    Review of Systems: As per HPI otherwise 10 point review of systems negative.   Past Medical History:  Diagnosis Date   Kidney stone    Ovarian cyst     Past Surgical History:  Procedure Laterality Date   APPENDECTOMY     WISDOM TOOTH EXTRACTION      Social History:  reports that she has never smoked. She has never used smokeless tobacco. She reports that she does not currently use alcohol. She reports that she does not use drugs.   No Known Allergies  Family History  Problem Relation Age of Onset   Ovarian cysts Mother    Hypertension Mother    Kidney Stones Mother    Kidney Stones Father    Diverticulitis Father     Family history reviewed and not pertinent ***   Prior to Admission medications   Medication Sig Start Date End Date Taking? Authorizing Provider  escitalopram (LEXAPRO) 5 MG tablet Take 5 mg by mouth daily. 02/25/23  Yes [provider]  ketorolac (TORADOL) 10 MG tablet Take 10 mg by mouth every 6 (six) hours as needed for moderate pain (pain score 4-6).  06/27/23  Yes [provider]  ondansetron (ZOFRAN-ODT) 4 MG disintegrating tablet Take 4 mg by mouth every 8 (eight) hours as needed for vomiting or nausea. 06/27/23  Yes [provider]  tamsulosin (FLOMAX) 0.4 MG CAPS capsule Take 0.4 mg by mouth daily. 06/27/23  Yes [provider]  traMADol (ULTRAM) 50 MG tablet Take 50 mg by mouth every 6 (six) hours as needed for moderate pain (pain score 4-6). 06/27/23  Yes [provider]  sulfamethoxazole-trimethoprim (BACTRIM DS) 800-160 MG tablet Take 1 tablet by mouth 2 (two) times daily. Patient not taking: Reported on 08/23/2023 10/23/21   Milas Hock, MD     Objective    Physical Exam: Vitals:   08/23/23 1600 08/23/23 1805 08/23/23 1930 08/23/23 2031  BP: (!) 103/59  103/69 (!) 107/59  Pulse: (!) 109  98 (!) 113  Resp: 20  (!) 22 18  Temp:  98.8 F (37.1 C)  98.2 F (36.8 C)  TempSrc:      SpO2: 99%  100% 100%  Weight:      Height:        General: appears to be stated age; alert, oriented Skin: warm, dry, no rash Head:  AT/[place] Mouth:  Oral mucosa membranes appear moist, normal dentition Neck: supple; trachea midline Heart:  RRR; did not appreciate any M/R/G Lungs:  CTAB, did not appreciate any wheezes, rales, or rhonchi Abdomen: + BS; soft, ND, NT Vascular: 2+ pedal pulses b/l; 2+ radial pulses b/l Extremities: no peripheral edema, no muscle wasting Neuro: strength and sensation intact in upper and lower extremities b/l ***   *** Neuro: 5/5 strength of the proximal and distal flexors and extensors of the upper and lower extremities bilaterally; sensation intact in upper and lower extremities b/l; cranial nerves II through XII grossly intact; no pronator drift; no evidence suggestive of slurred speech, dysarthria, or facial droop; Normal muscle tone. No tremors.  *** Neuro: In the setting of the patient's current mental status and associated inability to follow instructions, unable to  perform full neurologic exam at this time.  As such, assessment of strength, sensation, and cranial nerves is limited at this time. Patient noted to spontaneously move all 4 extremities. No tremors.  ***    Labs on Admission: I have personally reviewed following labs and imaging studies  CBC: Recent Labs  Lab 08/23/23 1405  WBC 15.3*  NEUTROABS 13.2*  HGB 10.5*  HCT 33.3*  MCV 82.6  PLT 203   Basic Metabolic Panel: Recent Labs  Lab 08/23/23 1405  NA 134*  K 3.5  CL 101  CO2 24  GLUCOSE 130*  BUN 10  CREATININE 0.93  CALCIUM 8.7*   GFR: Estimated Creatinine Clearance: 72.1 mL/min (by C-G formula based on SCr of 0.93 mg/dL). Liver Function Tests: Recent Labs  Lab 08/23/23 1405  AST 12*  ALT 6  ALKPHOS 40  BILITOT 0.7  PROT 7.3  ALBUMIN 4.2   No results for input(s): "LIPASE", "AMYLASE" in the last 168 hours. No results for input(s): "AMMONIA" in the last 168 hours. Coagulation Profile: No results for input(s): "INR", "PROTIME" in the last 168 hours. Cardiac Enzymes: No results for input(s): "CKTOTAL", "CKMB", "CKMBINDEX", "TROPONINI" in the last 168 hours. BNP (last 3 results) No results for input(s): "PROBNP" in the last 8760 hours. HbA1C: No results for input(s): "HGBA1C" in the last 72 hours. CBG: No results for input(s): "GLUCAP" in the last 168 hours. Lipid Profile: No results for input(s): "CHOL", "HDL", "LDLCALC", "TRIG", "CHOLHDL", "LDLDIRECT" in the last 72 hours. Thyroid Function Tests: No results for input(s): "TSH", "T4TOTAL", "FREET4", "T3FREE", "THYROIDAB" in the last 72 hours. Anemia Panel: No results for input(s): "VITAMINB12", "FOLATE", "FERRITIN", "TIBC", "IRON", "RETICCTPCT" in the last 72 hours. Urine analysis:    Component Value Date/Time   COLORURINE YELLOW 08/23/2023 1405   APPEARANCEUR HAZY (A) 08/23/2023 1405   LABSPEC 1.017 08/23/2023 1405   PHURINE 6.5 08/23/2023 1405   GLUCOSEU NEGATIVE 08/23/2023 1405   HGBUR SMALL (A)  08/23/2023 1405   BILIRUBINUR NEGATIVE 08/23/2023 1405   KETONESUR TRACE (A) 08/23/2023 1405   PROTEINUR 30 (A) 08/23/2023 1405   NITRITE NEGATIVE 08/23/2023 1405   LEUKOCYTESUR LARGE (A) 08/23/2023 1405    Radiological Exams on Admission: CT ABDOMEN PELVIS W CONTRAST Result Date: 08/23/2023 CLINICAL DATA:  Abdominal and flank pain EXAM: CT ABDOMEN AND PELVIS WITH CONTRAST TECHNIQUE: Multidetector CT imaging of the abdomen and pelvis was performed using the standard protocol following bolus administration of intravenous contrast. RADIATION DOSE REDUCTION: This exam was performed according to the departmental dose-optimization program which includes automated exposure control, adjustment of the mA and/or kV according to patient size and/or use of iterative reconstruction technique. CONTRAST:  80mL OMNIPAQUE IOHEXOL 300 MG/ML  SOLN COMPARISON:  06/27/2023 FINDINGS: Lower chest: Dependent subsegmental atelectasis in the posterior basal segments of both lower  lobes. Hepatobiliary: Unremarkable Pancreas: Unremarkable Spleen: Unremarkable Adrenals/Urinary Tract: Both adrenal glands appear normal. Moderate right hydronephrosis with perirenal and periureteral stranding along with moderate right hydroureter extending to a 0.8 cm distal ureteral calculus located about 5 cm proximal to the right UVJ. Mildly delayed enhancement of the right kidney compatible with obstruction. Faint cortical hypodensity posterolaterally along the right kidney, probably related to the obstruction, cannot completely exclude pyelonephritis. No abnormal gas or abscess. Stomach/Bowel: Appendix not well seen.  No definite acute findings. Vascular/Lymphatic: Unremarkable Reproductive: 3.0 by 1.7 cm right ovarian cystic lesion. Other: No supplemental non-categorized findings. Musculoskeletal: Unremarkable IMPRESSION: 1. Moderate right hydronephrosis and hydroureter extending to a 0.8 cm distal ureteral calculus located about 5 cm proximal to  the right UVJ. Associated delayed enhancement the right kidney. 2. Faint cortical hypodensity posterolaterally along the right kidney, probably related to the obstruction, cannot completely exclude pyelonephritis. 3. 3.0 by 1.7 cm right ovarian cystic lesion. No further imaging workup of this lesion is indicated. 4. Dependent subsegmental atelectasis in the posterior basal segments of both lower lobes. Electronically Signed   By: Gaylyn Rong M.D.   On: 08/23/2023 17:12      Assessment/Plan   Principal Problem:   Sepsis due to gram-negative UTI (HCC) Active Problems:   Sepsis secondary to UTI (HCC)   ***            ***                ***                 ***               ***               ***               ***                ***               ***               ***               ***               ***              ***          ***  DVT prophylaxis: SCD's ***  Code Status: Full code*** Family Communication: none*** Disposition Plan: Per Rounding Team Consults called: none***;  Admission status: ***     I SPENT GREATER THAN 75 *** MINUTES IN CLINICAL CARE TIME/MEDICAL DECISION-MAKING IN COMPLETING THIS ADMISSION.      Chaney Born Lorraine Gaulin DO [facility]  From 7PM - 7AM   08/23/2023, 9:29 PM   ***

## 2023-08-23 NOTE — ED Notes (Addendum)
 [place] with cl called for transport to MAU  ERROR

## 2023-08-23 NOTE — ED Notes (Signed)
 1st culture set drawn in triage before antibiotics. 2nd set drawn after antibiotics- ordered after antibiotics started.

## 2023-08-23 NOTE — ED Notes (Signed)
 Report attempted... "RN busy".Marland KitchenMarland Kitchen

## 2023-08-23 NOTE — ED Notes (Signed)
 Verified 2g rocephin to be given. Total.

## 2023-08-23 NOTE — Op Note (Signed)
.  Preoperative diagnosis: right ureteral stone, sepsis  Postoperative diagnosis: Same  Procedure: 1 cystoscopy 2. right retrograde pyelography 3.  Intraoperative fluoroscopy, under one hour, with interpretation 4. right 6 x 24 JJ stent placement  Attending: Wilkie Aye  Anesthesia: General  Estimated blood loss: None  Drains: Right 6 x 24 JJ ureteral stent without tether, 16 French foley catheter  Specimens: none  Antibiotics: rocephin  Findings:right mid ureteral stone. Moderate hydronephrosis. No masses/lesions in the bladder. Ureteral orifices in normal anatomic location.  Indications: Patient is a 33 year old female with a history of left ureteral stone and concern for sepsis.  After discussing treatment options, they decided proceed with right stent placement.  Procedure in detail: The patient was brought to the operating room and a brief timeout was done to ensure correct patient, correct procedure, correct site.  General anesthesia was administered patient was placed in dorsal lithotomy position.  Their genitalia was then prepped and draped in usual sterile fashion.  A rigid 22 French cystoscope was passed in the urethra and the bladder.  Bladder was inspected free masses or lesions.  the ureteral orifices were in the normal orthotopic locations.  a 6 french ureteral catheter was then instilled into the right ureteral orifice.  a gentle retrograde was obtained and findings noted above.  we then placed a zip wire through the ureteral catheter and advanced up to the renal pelvis.    We then placed a 6 x 24 double-j ureteral stent over the original zip wire.  We then removed the wire and good coil was noted in the the renal pelvis under fluoroscopy and the bladder under direct vision.  A foley catheter was then placed. the bladder was then drained and this concluded the procedure which was well tolerated by patient.  Complications: None  Condition: Stable, extubated, transferred  to PACU  Plan: Patient is to be admitted for IV antibiotics. She will have her stone extraction in 2 weeks.

## 2023-08-23 NOTE — H&P (View-Only) (Signed)
 Urology Consult  Referring physician: Dr. Arlean Hopping Reason for referral: right ureteral calculus, sepsis  Chief Complaint: right flank pain  History of Present Illness: Lorraine Barker is a 33yo with a history of nephrolithiasis. Who presented to the Er with a 3 day history of worsening right flank pain, dysuria and fevers. She has had numerous stone events and has never required surgical intervention for her calculi. She was initially diagnosed with this stone in 12/24 at [facility] the past 3 days she has developed worsening urinary urgency, frequency, and dysuria. CT from [facility] shows an 8mm right mid ureteral calculus with moderate hydronephrosis. UA concerning for infection. WBC count 15. Temp 99.9  Past Medical History:  Diagnosis Date   Kidney stone    Dec '24   Ovarian cyst    Past Surgical History:  Procedure Laterality Date   APPENDECTOMY     WISDOM TOOTH EXTRACTION      Medications: I have reviewed the patient's current medications. Allergies: No Known Allergies  Family History  Problem Relation Age of Onset   Ovarian cysts Mother    Hypertension Mother    Kidney Stones Mother    Kidney Stones Father    Diverticulitis Father    Social History:  reports that she has never smoked. She has never used smokeless tobacco. She reports that she does not currently use alcohol. She reports that she does not use drugs.  Review of Systems  Constitutional:  Positive for chills and fever.  Genitourinary:  Positive for dysuria and flank pain.  All other systems reviewed and are negative.   Physical Exam:  Vital signs in last 24 hours: Temp:  [98.2 F (36.8 C)-99.9 F (37.7 C)] 98.2 F (36.8 C) (02/24 2031) Pulse Rate:  [98-147] 113 (02/24 2031) Resp:  [18-22] 18 (02/24 2031) BP: (94-107)/(59-69) 107/59 (02/24 2031) SpO2:  [99 %-100 %] 100 % (02/24 2031) Weight:  [61.2 kg] 61.2 kg (02/24 1450) Physical Exam Vitals reviewed.  Constitutional:       Appearance: Normal appearance.  HENT:     Head: Normocephalic and atraumatic.     Nose: Nose normal. No congestion.     Mouth/Throat:     Mouth: Mucous membranes are dry.  Eyes:     Extraocular Movements: Extraocular movements intact.     Pupils: Pupils are equal, round, and reactive to light.  Cardiovascular:     Rate and Rhythm: Normal rate and regular rhythm.  Abdominal:     General: Abdomen is flat. There is no distension.  Musculoskeletal:        General: No swelling. Normal range of motion.     Cervical back: Normal range of motion and neck supple.  Skin:    General: Skin is warm and dry.  Neurological:     General: No focal deficit present.     Mental Status: She is alert and oriented to person, place, and time.  Psychiatric:        Mood and Affect: Mood normal.        Behavior: Behavior normal.        Thought Content: Thought content normal.        Judgment: Judgment normal.     Laboratory Data:  Results for orders placed or performed during the hospital encounter of 08/23/23 (from the past 72 hours)  Pregnancy, urine     Status: None   Collection Time: 08/23/23  2:05 PM  Result Value Ref Range   Preg Test, Ur NEGATIVE  NEGATIVE    Comment:        THE SENSITIVITY OF THIS METHODOLOGY IS >25 mIU/mL. Performed at [facility], [address] [place], [place] [address]   Lactic acid, plasma     Status: None   Collection Time: 08/23/23  2:05 PM  Result Value Ref Range   Lactic Acid, Venous 0.8 0.5 - 1.9 mmol/L    Comment: Performed at [facility], [address], [place], [place] [address]  Comprehensive metabolic panel     Status: Abnormal   Collection Time: 08/23/23  2:05 PM  Result Value Ref Range   Sodium 134 (L) 135 - 145 mmol/L   Potassium 3.5 3.5 - 5.1 mmol/L   Chloride 101 98 - 111 mmol/L   CO2 24 22 - 32 mmol/L   Glucose, Bld 130 (H) 70 - 99 mg/dL    Comment: Glucose reference range applies only to samples taken  after fasting for at least 8 hours.   BUN 10 6 - 20 mg/dL   Creatinine, Ser 9.14 0.44 - 1.00 mg/dL   Calcium 8.7 (L) 8.9 - 10.3 mg/dL   Total Protein 7.3 6.5 - 8.1 g/dL   Albumin 4.2 3.5 - 5.0 g/dL   AST 12 (L) 15 - 41 U/L   ALT 6 0 - 44 U/L   Alkaline Phosphatase 40 38 - 126 U/L   Total Bilirubin 0.7 0.0 - 1.2 mg/dL   GFR, Estimated >78 >29 mL/min    Comment: (NOTE) Calculated using the CKD-EPI Creatinine Equation (2021)    Anion gap 9 5 - 15    Comment: Performed at [facility], [address], [place], [place] [address]  CBC with Differential     Status: Abnormal   Collection Time: 08/23/23  2:05 PM  Result Value Ref Range   WBC 15.3 (H) 4.0 - 10.5 K/uL   RBC 4.03 3.87 - 5.11 MIL/uL   Hemoglobin 10.5 (L) 12.0 - 15.0 g/dL   HCT 08.6 (L) 57.8 - 46.9 %   MCV 82.6 80.0 - 100.0 fL   MCH 26.1 26.0 - 34.0 pg   MCHC 31.5 30.0 - 36.0 g/dL   RDW 62.9 52.8 - 41.3 %   Platelets 203 150 - 400 K/uL   nRBC 0.0 0.0 - 0.2 %   Neutrophils Relative % 86 %   Neutro Abs 13.2 (H) 1.7 - 7.7 K/uL   Lymphocytes Relative 5 %   Lymphs Abs 0.7 0.7 - 4.0 K/uL   Monocytes Relative 8 %   Monocytes Absolute 1.2 (H) 0.1 - 1.0 K/uL   Eosinophils Relative 0 %   Eosinophils Absolute 0.1 0.0 - 0.5 K/uL   Basophils Relative 0 %   Basophils Absolute 0.0 0.0 - 0.1 K/uL   Immature Granulocytes 1 %   Abs Immature Granulocytes 0.07 0.00 - 0.07 K/uL    Comment: Performed at [facility], [address], [place], [place] [address]  Urinalysis, w/ Reflex to Culture (Infection Suspected) -Urine, Clean Catch     Status: Abnormal   Collection Time: 08/23/23  2:05 PM  Result Value Ref Range   Specimen Source URINE, CLEAN CATCH    Color, Urine YELLOW YELLOW   APPearance HAZY (A) CLEAR   Specific Gravity, Urine 1.017 1.005 - 1.030   pH 6.5 5.0 - 8.0   Glucose, UA NEGATIVE NEGATIVE mg/dL   Hgb urine dipstick SMALL (A) NEGATIVE   Bilirubin Urine NEGATIVE NEGATIVE   Ketones,  ur TRACE (A)  NEGATIVE mg/dL   Protein, ur 30 (A) NEGATIVE mg/dL   Nitrite NEGATIVE NEGATIVE   Leukocytes,Ua LARGE (A) NEGATIVE   RBC / HPF 0-5 0 - 5 RBC/hpf   WBC, UA >50 0 - 5 WBC/hpf    Comment:        Reflex urine culture not performed if WBC <=10, OR if Squamous epithelial cells >5. If Squamous epithelial cells >5 suggest recollection.    Bacteria, UA RARE (A) NONE SEEN   Squamous Epithelial / HPF 0-5 0 - 5 /HPF   Mucus PRESENT     Comment: Performed at [facility], [address], [place], [place] [address]  Resp panel by RT-PCR (RSV, Flu A&B, Covid) Anterior Nasal Swab     Status: None   Collection Time: 08/23/23  2:54 PM   Specimen: Anterior Nasal Swab  Result Value Ref Range   SARS Coronavirus 2 by RT PCR NEGATIVE NEGATIVE    Comment: (NOTE) SARS-CoV-2 target nucleic acids are NOT DETECTED.  The SARS-CoV-2 RNA is generally detectable in upper respiratory specimens during the acute phase of infection. The lowest concentration of SARS-CoV-2 viral copies this assay can detect is 138 copies/mL. A negative result does not preclude SARS-Cov-2 infection and should not be used as the sole basis for treatment or other patient management decisions. A negative result may occur with  improper specimen collection/handling, submission of specimen other than nasopharyngeal swab, presence of viral mutation(s) within the areas targeted by this assay, and inadequate number of viral copies(<138 copies/mL). A negative result must be combined with clinical observations, patient history, and epidemiological information. The expected result is Negative.  Fact Sheet for Patients:  BloggerCourse.com  Fact Sheet for Healthcare Providers:  SeriousBroker.it  This test is no t yet approved or cleared by the [place] FDA and  has been authorized for detection and/or diagnosis of SARS-CoV-2 by FDA under an Emergency Use  Authorization (EUA). This EUA will remain  in effect (meaning this test can be used) for the duration of the COVID-19 declaration under Section 564(b)(1) of the Act, 21 U.S.C.section 360bbb-3(b)(1), unless the authorization is terminated  or revoked sooner.       Influenza A by PCR NEGATIVE NEGATIVE   Influenza B by PCR NEGATIVE NEGATIVE    Comment: (NOTE) The Xpert Xpress SARS-CoV-2/FLU/RSV plus assay is intended as an aid in the diagnosis of influenza from Nasopharyngeal swab specimens and should not be used as a sole basis for treatment. Nasal washings and aspirates are unacceptable for Xpert Xpress SARS-CoV-2/FLU/RSV testing.  Fact Sheet for Patients: BloggerCourse.com  Fact Sheet for Healthcare Providers: SeriousBroker.it  This test is not yet approved or cleared by the [place] FDA and has been authorized for detection and/or diagnosis of SARS-CoV-2 by FDA under an Emergency Use Authorization (EUA). This EUA will remain in effect (meaning this test can be used) for the duration of the COVID-19 declaration under Section 564(b)(1) of the Act, 21 U.S.C. section 360bbb-3(b)(1), unless the authorization is terminated or revoked.     Resp Syncytial Virus by PCR NEGATIVE NEGATIVE    Comment: (NOTE) Fact Sheet for Patients: BloggerCourse.com  Fact Sheet for Healthcare Providers: SeriousBroker.it  This test is not yet approved or cleared by the [place] FDA and has been authorized for detection and/or diagnosis of SARS-CoV-2 by FDA under an Emergency Use Authorization (EUA). This EUA will remain in effect (meaning this test can be used) for the duration of the COVID-19 declaration under Section 564(b)(1) of  the Act, 21 U.S.C. section 360bbb-3(b)(1), unless the authorization is terminated or revoked.  Performed at [facility], [address] [place], [place] [address]    Recent Results (from the past 240 hours)  Resp panel by RT-PCR (RSV, Flu A&B, Covid) Anterior Nasal Swab     Status: None   Collection Time: 08/23/23  2:54 PM   Specimen: Anterior Nasal Swab  Result Value Ref Range Status   SARS Coronavirus 2 by RT PCR NEGATIVE NEGATIVE Final    Comment: (NOTE) SARS-CoV-2 target nucleic acids are NOT DETECTED.  The SARS-CoV-2 RNA is generally detectable in upper respiratory specimens during the acute phase of infection. The lowest concentration of SARS-CoV-2 viral copies this assay can detect is 138 copies/mL. A negative result does not preclude SARS-Cov-2 infection and should not be used as the sole basis for treatment or other patient management decisions. A negative result may occur with  improper specimen collection/handling, submission of specimen other than nasopharyngeal swab, presence of viral mutation(s) within the areas targeted by this assay, and inadequate number of viral copies(<138 copies/mL). A negative result must be combined with clinical observations, patient history, and epidemiological information. The expected result is Negative.  Fact Sheet for Patients:  BloggerCourse.com  Fact Sheet for Healthcare Providers:  SeriousBroker.it  This test is no t yet approved or cleared by the [place] FDA and  has been authorized for detection and/or diagnosis of SARS-CoV-2 by FDA under an Emergency Use Authorization (EUA). This EUA will remain  in effect (meaning this test can be used) for the duration of the COVID-19 declaration under Section 564(b)(1) of the Act, 21 U.S.C.section 360bbb-3(b)(1), unless the authorization is terminated  or revoked sooner.       Influenza A by PCR NEGATIVE NEGATIVE Final   Influenza B by PCR NEGATIVE NEGATIVE Final    Comment: (NOTE) The Xpert Xpress SARS-CoV-2/FLU/RSV plus assay is intended as an aid in the  diagnosis of influenza from Nasopharyngeal swab specimens and should not be used as a sole basis for treatment. Nasal washings and aspirates are unacceptable for Xpert Xpress SARS-CoV-2/FLU/RSV testing.  Fact Sheet for Patients: BloggerCourse.com  Fact Sheet for Healthcare Providers: SeriousBroker.it  This test is not yet approved or cleared by the [place] FDA and has been authorized for detection and/or diagnosis of SARS-CoV-2 by FDA under an Emergency Use Authorization (EUA). This EUA will remain in effect (meaning this test can be used) for the duration of the COVID-19 declaration under Section 564(b)(1) of the Act, 21 U.S.C. section 360bbb-3(b)(1), unless the authorization is terminated or revoked.     Resp Syncytial Virus by PCR NEGATIVE NEGATIVE Final    Comment: (NOTE) Fact Sheet for Patients: BloggerCourse.com  Fact Sheet for Healthcare Providers: SeriousBroker.it  This test is not yet approved or cleared by the [place] FDA and has been authorized for detection and/or diagnosis of SARS-CoV-2 by FDA under an Emergency Use Authorization (EUA). This EUA will remain in effect (meaning this test can be used) for the duration of the COVID-19 declaration under Section 564(b)(1) of the Act, 21 U.S.C. section 360bbb-3(b)(1), unless the authorization is terminated or revoked.  Performed at [facility], [address], [place], [place] [address]    Creatinine: Recent Labs    08/23/23 1405  CREATININE 0.93   Baseline Creatinine: 0.9  Impression/Assessment:  32yo with right ureteral calculus, sepsis  Plan:  -We discussed the management of kidney stones. These options include observation, ureteroscopy, shockwave lithotripsy (  ESWL) and percutaneous nephrolithotomy (PCNL). We discussed which options are relevant to the patient's stone(s). We  discussed the natural history of kidney stones as well as the complications of untreated stones and the impact on quality of life without treatment as well as with each of the above listed treatments. We also discussed the efficacy of each treatment in its ability to clear the stone burden. With any of these management options I discussed the signs and symptoms of infection and the need for emergent treatment should these be experienced. For each option we discussed the ability of each procedure to clear the patient of their stone burden.   For observation I described the risks which include but are not limited to silent renal damage, life-threatening infection, need for emergent surgery, failure to pass stone and pain.   For ureteroscopy I described the risks which include bleeding, infection, damage to contiguous structures, positioning injury, ureteral stricture, ureteral avulsion, ureteral injury, need for prolonged ureteral stent, inability to perform ureteroscopy, need for an interval procedure, inability to clear stone burden, stent discomfort/pain, heart attack, stroke, pulmonary embolus and the inherent risks with general anesthesia.   For shockwave lithotripsy I described the risks which include arrhythmia, kidney contusion, kidney hemorrhage, need for transfusion, pain, inability to adequately break up stone, inability to pass stone fragments, Steinstrasse, infection associated with obstructing stones, need for alternate surgical procedure, need for repeat shockwave lithotripsy, MI, CVA, PE and the inherent risks with anesthesia/conscious sedation.   For PCNL I described the risks including positioning injury, pneumothorax, hydrothorax, need for chest tube, inability to clear stone burden, renal laceration, arterial venous fistula or malformation, need for embolization of kidney, loss of kidney or renal function, need for repeat procedure, need for prolonged nephrostomy tube, ureteral avulsion,  MI, CVA, PE and the inherent risks of general anesthesia.   - The patient would like to proceed with right ureteral stent placement  Wilkie Aye 08/23/2023, 10:25 PM

## 2023-08-23 NOTE — ED Provider Notes (Signed)
 [place] EMERGENCY DEPARTMENT AT [facility] Provider Note   CSN: [id number] Arrival date & time: 08/23/23  1322     History  No chief complaint on file.   Lorraine Barker is a 33 y.o. female, history of ovarian cyst, kidney stone, appendectomy, who presents to the ED secondary to right flank pain x 1 day.  She states that she had a kidney stone in December, and felt very similar.  Her pain has been sharp, stabbing, associated with nausea, vomiting.  Pain is localized to the right flank radiating to the back.  No urinary frequency, urgency, urinary retention, dysuria, hematuria, or abdominal pain.  She has had a little bit of creamy white discharge, but she states is fairly normal.  She has been taking the Flomax which she states helps a little bit.  Notes that her pain is a 7 out of 10 currently.  Has not noticed any upper respiratory symptoms.    Home Medications Prior to Admission medications   Medication Sig Start Date End Date Taking? Authorizing Provider  sulfamethoxazole-trimethoprim (BACTRIM DS) 800-160 MG tablet Take 1 tablet by mouth 2 (two) times daily. 10/23/21   Lorraine Hock, MD      Allergies    Patient has no known allergies.    Review of Systems   Review of Systems  Constitutional:  Negative for fever.  Genitourinary:  Positive for flank pain.    Physical Exam Updated Vital Signs BP 103/69   Pulse 98   Temp 98.8 F (37.1 C)   Resp (!) 22   Ht 5' 3.5" (1.613 m)   Wt 61.2 kg   SpO2 100%   BMI 23.54 kg/m  Physical Exam Vitals and nursing note reviewed.  Constitutional:      General: She is not in acute distress.    Appearance: She is well-developed.     Comments: Pale appearing  HENT:     Head: Normocephalic and atraumatic.  Eyes:     Conjunctiva/sclera: Conjunctivae normal.  Cardiovascular:     Rate and Rhythm: Normal rate and regular rhythm.     Heart sounds: No murmur heard. Pulmonary:     Effort: Pulmonary effort is normal. No  respiratory distress.     Breath sounds: Normal breath sounds.  Abdominal:     Palpations: Abdomen is soft.     Tenderness: There is abdominal tenderness. There is right CVA tenderness.  Musculoskeletal:        General: No swelling.     Cervical back: Neck supple.  Skin:    General: Skin is warm and dry.     Capillary Refill: Capillary refill takes less than 2 seconds.  Neurological:     Mental Status: She is alert.  Psychiatric:        Mood and Affect: Mood normal.     ED Results / Procedures / Treatments   Labs (all labs ordered are listed, but only abnormal results are displayed) Labs Reviewed  COMPREHENSIVE METABOLIC PANEL - Abnormal; Notable for the following components:      Result Value   Sodium 134 (*)    Glucose, Bld 130 (*)    Calcium 8.7 (*)    AST 12 (*)    All other components within normal limits  CBC WITH DIFFERENTIAL/PLATELET - Abnormal; Notable for the following components:   WBC 15.3 (*)    Hemoglobin 10.5 (*)    HCT 33.3 (*)    Neutro Abs 13.2 (*)    Monocytes Absolute 1.2 (*)  All other components within normal limits  URINALYSIS, W/ REFLEX TO CULTURE (INFECTION SUSPECTED) - Abnormal; Notable for the following components:   APPearance HAZY (*)    Hgb urine dipstick Lorraine Barker (*)    Ketones, ur TRACE (*)    Protein, ur 30 (*)    Leukocytes,Ua LARGE (*)    Bacteria, UA RARE (*)    All other components within normal limits  RESP PANEL BY RT-PCR (RSV, FLU A&B, COVID)  RVPGX2  CULTURE, BLOOD (ROUTINE X 2)  CULTURE, BLOOD (ROUTINE X 2)  URINE CULTURE  PREGNANCY, URINE  LACTIC ACID, PLASMA    EKG EKG Interpretation Date/Time:  Monday August 23 2023 13:57:59 EST Ventricular Rate:  142 PR Interval:  128 QRS Duration:  86 QT Interval:  346 QTC Calculation: 532 R Axis:   55  Text Interpretation: Sinus tachycardia Nonspecific ST and T wave abnormality Abnormal ECG When compared with ECG of 30-Jul-1999 13:02, PREVIOUS ECG IS PRESENT Since last  tracing rate faster Confirmed by Lorraine Barker [phone]) on 08/23/2023 2:10:46 PM  Radiology CT ABDOMEN PELVIS W CONTRAST Result Date: 08/23/2023 CLINICAL DATA:  Abdominal and flank pain EXAM: CT ABDOMEN AND PELVIS WITH CONTRAST TECHNIQUE: Multidetector CT imaging of the abdomen and pelvis was performed using the standard protocol following bolus administration of intravenous contrast. RADIATION DOSE REDUCTION: This exam was performed according to the departmental dose-optimization program which includes automated exposure control, adjustment of the mA and/or kV according to patient size and/or use of iterative reconstruction technique. CONTRAST:  80mL OMNIPAQUE IOHEXOL 300 MG/ML  SOLN COMPARISON:  06/27/2023 FINDINGS: Lower chest: Dependent subsegmental atelectasis in the posterior basal segments of both lower lobes. Hepatobiliary: Unremarkable Pancreas: Unremarkable Spleen: Unremarkable Adrenals/Urinary Tract: Both adrenal glands appear normal. Moderate right hydronephrosis with perirenal and periureteral stranding along with moderate right hydroureter extending to a 0.8 cm distal ureteral calculus located about 5 cm proximal to the right UVJ. Mildly delayed enhancement of the right kidney compatible with obstruction. Faint cortical hypodensity posterolaterally along the right kidney, probably related to the obstruction, cannot completely exclude pyelonephritis. No abnormal gas or abscess. Stomach/Bowel: Appendix not well seen.  No definite acute findings. Vascular/Lymphatic: Unremarkable Reproductive: 3.0 by 1.7 cm right ovarian cystic lesion. Other: No supplemental non-categorized findings. Musculoskeletal: Unremarkable IMPRESSION: 1. Moderate right hydronephrosis and hydroureter extending to a 0.8 cm distal ureteral calculus located about 5 cm proximal to the right UVJ. Associated delayed enhancement the right kidney. 2. Faint cortical hypodensity posterolaterally along the right kidney, probably related to  the obstruction, cannot completely exclude pyelonephritis. 3. 3.0 by 1.7 cm right ovarian cystic lesion. No further imaging workup of this lesion is indicated. 4. Dependent subsegmental atelectasis in the posterior basal segments of both lower lobes. Electronically Signed   By: Gaylyn Rong M.D.   On: 08/23/2023 17:12    Procedures Procedures    Medications Ordered in ED Medications  ondansetron (ZOFRAN) injection 4 mg (4 mg Intravenous Given 08/23/23 1402)  sodium chloride 0.9 % bolus 1,000 mL (0 mLs Intravenous Stopped 08/23/23 1451)  ketorolac (TORADOL) 15 MG/ML injection 15 mg (15 mg Intravenous Given 08/23/23 1544)  cefTRIAXone (ROCEPHIN) 1 g in sodium chloride 0.9 % 100 mL IVPB (0 g Intravenous Stopped 08/23/23 1659)  sodium chloride 0.9 % bolus 1,000 mL (0 mLs Intravenous Stopped 08/23/23 1535)  iohexol (OMNIPAQUE) 300 MG/ML solution 100 mL (80 mLs Intravenous Contrast Given 08/23/23 1550)    ED Course/ Medical Decision Making/ A&P  Medical Decision Making Patient is a 33 year old female, here for right flank pain, she appears pale, and temp is slightly elevated.  Given her symptoms, tachycardia in the 140s, we will start her on ceftriaxone, given concern for possible infected stone.  Lactic acid, blood work and CT ab pelvis ordered.  Toradol for pain  Amount and/or Complexity of Data Reviewed Labs: ordered.    Details: Urinalysis, shows large leukocytes, rare bacteria, and many white blood cell, she has a white count of 15,000 K Radiology: ordered.    Details: CT abdomen pelvis, shows likely pyelonephritis, with 0.8 cm distal ureteral calculus 5 cm proximal to the UVJ with hydronephrosis Discussion of management or test interpretation with external provider(s): Patient has findings concerning for infected stone, she has already received ceftriaxone, sepsis activated and was alerted once she met SIRS criteria.  I immediately called Dr. Ronne Binning, who  stated the patient will need to be taken for surgery, to have a stent placed.  I informed the patient of this, we are prioritizing her, and [facility] is aware of this, Dr. Ronne Binning is working on getting an anesthesiologist.  Admitted to Dr. Hanley Hays, with plans to for the patient be taken to surgery tonight, for stent placement, and will need IV antibiotics afterwards.  Risk Prescription drug management. Decision regarding hospitalization.   CRITICAL CARE Performed by: Pete Pelt   Total critical care time: 60 minutes  Critical care time was exclusive of separately billable procedures and treating other patients.  Critical care was necessary to treat or prevent imminent or life-threatening deterioration.  Critical care was time spent personally by me on the following activities: development of treatment plan with patient and/or surrogate as well as nursing, discussions with consultants, evaluation of patient's response to treatment, examination of patient, obtaining history from patient or surrogate, ordering and performing treatments and interventions, ordering and review of laboratory studies, ordering and review of radiographic studies, pulse oximetry and re-evaluation of patient's condition.  Final Clinical Impression(s) / ED Diagnoses Final diagnoses:  Ureteral stone with hydronephrosis  SIRS (systemic inflammatory response syndrome) (HCC)  Pyelonephritis    Rx / DC Orders ED Discharge Orders     None         Pete Pelt, PA 08/23/23 Hershal Coria, MD 08/24/23 [phone]

## 2023-08-23 NOTE — Transfer of Care (Signed)
 Immediate Anesthesia Transfer of Care Note  Patient: Lorraine Barker  Procedure(s) Performed: RETROGRADE CYSTOSCOPY WITH RIGHT URETERAL STENT PLACEMENT (Right: Ureter)  Patient Location: PACU  Anesthesia Type:General  Level of Consciousness: awake and alert   Airway & Oxygen Therapy: Patient Spontanous Breathing and Patient connected to face mask oxygen  Post-op Assessment: Report given to RN and Post -op Vital signs reviewed and stable  Post vital signs: Reviewed and stable  Last Vitals:  Vitals Value Taken Time  BP 100/50 08/23/23 2312  Temp    Pulse 113 08/23/23 2312  Resp 17 08/23/23 2312  SpO2 96 % 08/23/23 2312  Vitals shown include unfiled device data.  Last Pain:  Vitals:   08/23/23 1511  TempSrc:   PainSc: 5          Complications: No notable events documented.

## 2023-08-23 NOTE — ED Notes (Signed)
 Feeling faint in triage. Warm to the touch. Clammy. BP appears soft sys just under . Fentanyl held at this time due to this and primary EMTP Josh notified.

## 2023-08-23 NOTE — ED Triage Notes (Signed)
 Right flank pain. On and off since Dec. Known kidney stones. Urinating still without difficulty. Afebrile. +N/-V. Denies CP, SOB.

## 2023-08-23 NOTE — Sepsis Progress Note (Signed)
 Elink will follow per sepsis protocol.

## 2023-08-23 NOTE — Consult Note (Signed)
 Urology Consult  Referring physician: Dr. Arlean Hopping Reason for referral: right ureteral calculus, sepsis  Chief Complaint: right flank pain  History of Present Illness: Lorraine Barker is a 33yo with a history of nephrolithiasis. Who presented to the Er with a 3 day history of worsening right flank pain, dysuria and fevers. She has had numerous stone events and has never required surgical intervention for her calculi. She was initially diagnosed with this stone in 12/24 at [facility] the past 3 days she has developed worsening urinary urgency, frequency, and dysuria. CT from [facility] shows an 8mm right mid ureteral calculus with moderate hydronephrosis. UA concerning for infection. WBC count 15. Temp 99.9  Past Medical History:  Diagnosis Date   Kidney stone    Dec '24   Ovarian cyst    Past Surgical History:  Procedure Laterality Date   APPENDECTOMY     WISDOM TOOTH EXTRACTION      Medications: I have reviewed the patient's current medications. Allergies: No Known Allergies  Family History  Problem Relation Age of Onset   Ovarian cysts Mother    Hypertension Mother    Kidney Stones Mother    Kidney Stones Father    Diverticulitis Father    Social History:  reports that she has never smoked. She has never used smokeless tobacco. She reports that she does not currently use alcohol. She reports that she does not use drugs.  Review of Systems  Constitutional:  Positive for chills and fever.  Genitourinary:  Positive for dysuria and flank pain.  All other systems reviewed and are negative.   Physical Exam:  Vital signs in last 24 hours: Temp:  [98.2 F (36.8 C)-99.9 F (37.7 C)] 98.2 F (36.8 C) (02/24 2031) Pulse Rate:  [98-147] 113 (02/24 2031) Resp:  [18-22] 18 (02/24 2031) BP: (94-107)/(59-69) 107/59 (02/24 2031) SpO2:  [99 %-100 %] 100 % (02/24 2031) Weight:  [61.2 kg] 61.2 kg (02/24 1450) Physical Exam Vitals reviewed.  Constitutional:       Appearance: Normal appearance.  HENT:     Head: Normocephalic and atraumatic.     Nose: Nose normal. No congestion.     Mouth/Throat:     Mouth: Mucous membranes are dry.  Eyes:     Extraocular Movements: Extraocular movements intact.     Pupils: Pupils are equal, round, and reactive to light.  Cardiovascular:     Rate and Rhythm: Normal rate and regular rhythm.  Abdominal:     General: Abdomen is flat. There is no distension.  Musculoskeletal:        General: No swelling. Normal range of motion.     Cervical back: Normal range of motion and neck supple.  Skin:    General: Skin is warm and dry.  Neurological:     General: No focal deficit present.     Mental Status: She is alert and oriented to person, place, and time.  Psychiatric:        Mood and Affect: Mood normal.        Behavior: Behavior normal.        Thought Content: Thought content normal.        Judgment: Judgment normal.     Laboratory Data:  Results for orders placed or performed during the hospital encounter of 08/23/23 (from the past 72 hours)  Pregnancy, urine     Status: None   Collection Time: 08/23/23  2:05 PM  Result Value Ref Range   Preg Test, Ur NEGATIVE  NEGATIVE    Comment:        THE SENSITIVITY OF THIS METHODOLOGY IS >25 mIU/mL. Performed at [facility], [address] [place], [place] [address]   Lactic acid, plasma     Status: None   Collection Time: 08/23/23  2:05 PM  Result Value Ref Range   Lactic Acid, Venous 0.8 0.5 - 1.9 mmol/L    Comment: Performed at [facility], [address], [place], [place] [address]  Comprehensive metabolic panel     Status: Abnormal   Collection Time: 08/23/23  2:05 PM  Result Value Ref Range   Sodium 134 (L) 135 - 145 mmol/L   Potassium 3.5 3.5 - 5.1 mmol/L   Chloride 101 98 - 111 mmol/L   CO2 24 22 - 32 mmol/L   Glucose, Bld 130 (H) 70 - 99 mg/dL    Comment: Glucose reference range applies only to samples taken  after fasting for at least 8 hours.   BUN 10 6 - 20 mg/dL   Creatinine, Ser 9.14 0.44 - 1.00 mg/dL   Calcium 8.7 (L) 8.9 - 10.3 mg/dL   Total Protein 7.3 6.5 - 8.1 g/dL   Albumin 4.2 3.5 - 5.0 g/dL   AST 12 (L) 15 - 41 U/L   ALT 6 0 - 44 U/L   Alkaline Phosphatase 40 38 - 126 U/L   Total Bilirubin 0.7 0.0 - 1.2 mg/dL   GFR, Estimated >78 >29 mL/min    Comment: (NOTE) Calculated using the CKD-EPI Creatinine Equation (2021)    Anion gap 9 5 - 15    Comment: Performed at [facility], [address], [place], [place] [address]  CBC with Differential     Status: Abnormal   Collection Time: 08/23/23  2:05 PM  Result Value Ref Range   WBC 15.3 (H) 4.0 - 10.5 K/uL   RBC 4.03 3.87 - 5.11 MIL/uL   Hemoglobin 10.5 (L) 12.0 - 15.0 g/dL   HCT 08.6 (L) 57.8 - 46.9 %   MCV 82.6 80.0 - 100.0 fL   MCH 26.1 26.0 - 34.0 pg   MCHC 31.5 30.0 - 36.0 g/dL   RDW 62.9 52.8 - 41.3 %   Platelets 203 150 - 400 K/uL   nRBC 0.0 0.0 - 0.2 %   Neutrophils Relative % 86 %   Neutro Abs 13.2 (H) 1.7 - 7.7 K/uL   Lymphocytes Relative 5 %   Lymphs Abs 0.7 0.7 - 4.0 K/uL   Monocytes Relative 8 %   Monocytes Absolute 1.2 (H) 0.1 - 1.0 K/uL   Eosinophils Relative 0 %   Eosinophils Absolute 0.1 0.0 - 0.5 K/uL   Basophils Relative 0 %   Basophils Absolute 0.0 0.0 - 0.1 K/uL   Immature Granulocytes 1 %   Abs Immature Granulocytes 0.07 0.00 - 0.07 K/uL    Comment: Performed at [facility], [address], [place], [place] [address]  Urinalysis, w/ Reflex to Culture (Infection Suspected) -Urine, Clean Catch     Status: Abnormal   Collection Time: 08/23/23  2:05 PM  Result Value Ref Range   Specimen Source URINE, CLEAN CATCH    Color, Urine YELLOW YELLOW   APPearance HAZY (A) CLEAR   Specific Gravity, Urine 1.017 1.005 - 1.030   pH 6.5 5.0 - 8.0   Glucose, UA NEGATIVE NEGATIVE mg/dL   Hgb urine dipstick SMALL (A) NEGATIVE   Bilirubin Urine NEGATIVE NEGATIVE   Ketones,  ur TRACE (A)  NEGATIVE mg/dL   Protein, ur 30 (A) NEGATIVE mg/dL   Nitrite NEGATIVE NEGATIVE   Leukocytes,Ua LARGE (A) NEGATIVE   RBC / HPF 0-5 0 - 5 RBC/hpf   WBC, UA >50 0 - 5 WBC/hpf    Comment:        Reflex urine culture not performed if WBC <=10, OR if Squamous epithelial cells >5. If Squamous epithelial cells >5 suggest recollection.    Bacteria, UA RARE (A) NONE SEEN   Squamous Epithelial / HPF 0-5 0 - 5 /HPF   Mucus PRESENT     Comment: Performed at [facility], [address], [place], [place] [address]  Resp panel by RT-PCR (RSV, Flu A&B, Covid) Anterior Nasal Swab     Status: None   Collection Time: 08/23/23  2:54 PM   Specimen: Anterior Nasal Swab  Result Value Ref Range   SARS Coronavirus 2 by RT PCR NEGATIVE NEGATIVE    Comment: (NOTE) SARS-CoV-2 target nucleic acids are NOT DETECTED.  The SARS-CoV-2 RNA is generally detectable in upper respiratory specimens during the acute phase of infection. The lowest concentration of SARS-CoV-2 viral copies this assay can detect is 138 copies/mL. A negative result does not preclude SARS-Cov-2 infection and should not be used as the sole basis for treatment or other patient management decisions. A negative result may occur with  improper specimen collection/handling, submission of specimen other than nasopharyngeal swab, presence of viral mutation(s) within the areas targeted by this assay, and inadequate number of viral copies(<138 copies/mL). A negative result must be combined with clinical observations, patient history, and epidemiological information. The expected result is Negative.  Fact Sheet for Patients:  BloggerCourse.com  Fact Sheet for Healthcare Providers:  SeriousBroker.it  This test is no t yet approved or cleared by the [place] FDA and  has been authorized for detection and/or diagnosis of SARS-CoV-2 by FDA under an Emergency Use  Authorization (EUA). This EUA will remain  in effect (meaning this test can be used) for the duration of the COVID-19 declaration under Section 564(b)(1) of the Act, 21 U.S.C.section 360bbb-3(b)(1), unless the authorization is terminated  or revoked sooner.       Influenza A by PCR NEGATIVE NEGATIVE   Influenza B by PCR NEGATIVE NEGATIVE    Comment: (NOTE) The Xpert Xpress SARS-CoV-2/FLU/RSV plus assay is intended as an aid in the diagnosis of influenza from Nasopharyngeal swab specimens and should not be used as a sole basis for treatment. Nasal washings and aspirates are unacceptable for Xpert Xpress SARS-CoV-2/FLU/RSV testing.  Fact Sheet for Patients: BloggerCourse.com  Fact Sheet for Healthcare Providers: SeriousBroker.it  This test is not yet approved or cleared by the [place] FDA and has been authorized for detection and/or diagnosis of SARS-CoV-2 by FDA under an Emergency Use Authorization (EUA). This EUA will remain in effect (meaning this test can be used) for the duration of the COVID-19 declaration under Section 564(b)(1) of the Act, 21 U.S.C. section 360bbb-3(b)(1), unless the authorization is terminated or revoked.     Resp Syncytial Virus by PCR NEGATIVE NEGATIVE    Comment: (NOTE) Fact Sheet for Patients: BloggerCourse.com  Fact Sheet for Healthcare Providers: SeriousBroker.it  This test is not yet approved or cleared by the [place] FDA and has been authorized for detection and/or diagnosis of SARS-CoV-2 by FDA under an Emergency Use Authorization (EUA). This EUA will remain in effect (meaning this test can be used) for the duration of the COVID-19 declaration under Section 564(b)(1) of  the Act, 21 U.S.C. section 360bbb-3(b)(1), unless the authorization is terminated or revoked.  Performed at [facility], [address] [place], [place] [address]    Recent Results (from the past 240 hours)  Resp panel by RT-PCR (RSV, Flu A&B, Covid) Anterior Nasal Swab     Status: None   Collection Time: 08/23/23  2:54 PM   Specimen: Anterior Nasal Swab  Result Value Ref Range Status   SARS Coronavirus 2 by RT PCR NEGATIVE NEGATIVE Final    Comment: (NOTE) SARS-CoV-2 target nucleic acids are NOT DETECTED.  The SARS-CoV-2 RNA is generally detectable in upper respiratory specimens during the acute phase of infection. The lowest concentration of SARS-CoV-2 viral copies this assay can detect is 138 copies/mL. A negative result does not preclude SARS-Cov-2 infection and should not be used as the sole basis for treatment or other patient management decisions. A negative result may occur with  improper specimen collection/handling, submission of specimen other than nasopharyngeal swab, presence of viral mutation(s) within the areas targeted by this assay, and inadequate number of viral copies(<138 copies/mL). A negative result must be combined with clinical observations, patient history, and epidemiological information. The expected result is Negative.  Fact Sheet for Patients:  BloggerCourse.com  Fact Sheet for Healthcare Providers:  SeriousBroker.it  This test is no t yet approved or cleared by the [place] FDA and  has been authorized for detection and/or diagnosis of SARS-CoV-2 by FDA under an Emergency Use Authorization (EUA). This EUA will remain  in effect (meaning this test can be used) for the duration of the COVID-19 declaration under Section 564(b)(1) of the Act, 21 U.S.C.section 360bbb-3(b)(1), unless the authorization is terminated  or revoked sooner.       Influenza A by PCR NEGATIVE NEGATIVE Final   Influenza B by PCR NEGATIVE NEGATIVE Final    Comment: (NOTE) The Xpert Xpress SARS-CoV-2/FLU/RSV plus assay is intended as an aid in the  diagnosis of influenza from Nasopharyngeal swab specimens and should not be used as a sole basis for treatment. Nasal washings and aspirates are unacceptable for Xpert Xpress SARS-CoV-2/FLU/RSV testing.  Fact Sheet for Patients: BloggerCourse.com  Fact Sheet for Healthcare Providers: SeriousBroker.it  This test is not yet approved or cleared by the [place] FDA and has been authorized for detection and/or diagnosis of SARS-CoV-2 by FDA under an Emergency Use Authorization (EUA). This EUA will remain in effect (meaning this test can be used) for the duration of the COVID-19 declaration under Section 564(b)(1) of the Act, 21 U.S.C. section 360bbb-3(b)(1), unless the authorization is terminated or revoked.     Resp Syncytial Virus by PCR NEGATIVE NEGATIVE Final    Comment: (NOTE) Fact Sheet for Patients: BloggerCourse.com  Fact Sheet for Healthcare Providers: SeriousBroker.it  This test is not yet approved or cleared by the [place] FDA and has been authorized for detection and/or diagnosis of SARS-CoV-2 by FDA under an Emergency Use Authorization (EUA). This EUA will remain in effect (meaning this test can be used) for the duration of the COVID-19 declaration under Section 564(b)(1) of the Act, 21 U.S.C. section 360bbb-3(b)(1), unless the authorization is terminated or revoked.  Performed at [facility], [address], [place], [place] [address]    Creatinine: Recent Labs    08/23/23 1405  CREATININE 0.93   Baseline Creatinine: 0.9  Impression/Assessment:  32yo with right ureteral calculus, sepsis  Plan:  -We discussed the management of kidney stones. These options include observation, ureteroscopy, shockwave lithotripsy (  ESWL) and percutaneous nephrolithotomy (PCNL). We discussed which options are relevant to the patient's stone(s). We  discussed the natural history of kidney stones as well as the complications of untreated stones and the impact on quality of life without treatment as well as with each of the above listed treatments. We also discussed the efficacy of each treatment in its ability to clear the stone burden. With any of these management options I discussed the signs and symptoms of infection and the need for emergent treatment should these be experienced. For each option we discussed the ability of each procedure to clear the patient of their stone burden.   For observation I described the risks which include but are not limited to silent renal damage, life-threatening infection, need for emergent surgery, failure to pass stone and pain.   For ureteroscopy I described the risks which include bleeding, infection, damage to contiguous structures, positioning injury, ureteral stricture, ureteral avulsion, ureteral injury, need for prolonged ureteral stent, inability to perform ureteroscopy, need for an interval procedure, inability to clear stone burden, stent discomfort/pain, heart attack, stroke, pulmonary embolus and the inherent risks with general anesthesia.   For shockwave lithotripsy I described the risks which include arrhythmia, kidney contusion, kidney hemorrhage, need for transfusion, pain, inability to adequately break up stone, inability to pass stone fragments, Steinstrasse, infection associated with obstructing stones, need for alternate surgical procedure, need for repeat shockwave lithotripsy, MI, CVA, PE and the inherent risks with anesthesia/conscious sedation.   For PCNL I described the risks including positioning injury, pneumothorax, hydrothorax, need for chest tube, inability to clear stone burden, renal laceration, arterial venous fistula or malformation, need for embolization of kidney, loss of kidney or renal function, need for repeat procedure, need for prolonged nephrostomy tube, ureteral avulsion,  MI, CVA, PE and the inherent risks of general anesthesia.   - The patient would like to proceed with right ureteral stent placement  Wilkie Aye 08/23/2023, 10:25 PM

## 2023-08-23 NOTE — Anesthesia Procedure Notes (Signed)
 Procedure Name: LMA Insertion Date/Time: 08/23/2023 10:35 PM  Performed by: Tressia Miners, CRNAPre-anesthesia Checklist: Patient identified, Emergency Drugs available, Suction available and Patient being monitored Patient Re-evaluated:Patient Re-evaluated prior to induction Oxygen Delivery Method: Circle System Utilized Preoxygenation: Pre-oxygenation with 100% oxygen Induction Type: IV induction Ventilation: Mask ventilation without difficulty LMA: LMA inserted LMA Size: 4.0 Number of attempts: 1 Airway Equipment and Method: Bite block Placement Confirmation: positive ETCO2 Tube secured with: Tape Dental Injury: Teeth and Oropharynx as per pre-operative assessment

## 2023-08-24 ENCOUNTER — Encounter (HOSPITAL_COMMUNITY): Payer: Self-pay | Admitting: Urology

## 2023-08-24 DIAGNOSIS — N132 Hydronephrosis with renal and ureteral calculous obstruction: Secondary | ICD-10-CM | POA: Diagnosis not present

## 2023-08-24 DIAGNOSIS — F32A Depression, unspecified: Secondary | ICD-10-CM | POA: Diagnosis present

## 2023-08-24 DIAGNOSIS — R109 Unspecified abdominal pain: Secondary | ICD-10-CM | POA: Diagnosis present

## 2023-08-24 DIAGNOSIS — D72829 Elevated white blood cell count, unspecified: Secondary | ICD-10-CM | POA: Diagnosis present

## 2023-08-24 LAB — CBC WITH DIFFERENTIAL/PLATELET
Abs Immature Granulocytes: 0.04 10*3/uL (ref 0.00–0.07)
Basophils Absolute: 0 10*3/uL (ref 0.0–0.1)
Basophils Relative: 0 %
Eosinophils Absolute: 0 10*3/uL (ref 0.0–0.5)
Eosinophils Relative: 0 %
HCT: 29 % — ABNORMAL LOW (ref 36.0–46.0)
Hemoglobin: 9.1 g/dL — ABNORMAL LOW (ref 12.0–15.0)
Immature Granulocytes: 1 %
Lymphocytes Relative: 4 %
Lymphs Abs: 0.3 10*3/uL — ABNORMAL LOW (ref 0.7–4.0)
MCH: 26 pg (ref 26.0–34.0)
MCHC: 31.4 g/dL (ref 30.0–36.0)
MCV: 82.9 fL (ref 80.0–100.0)
Monocytes Absolute: 0.3 10*3/uL (ref 0.1–1.0)
Monocytes Relative: 3 %
Neutro Abs: 7.7 10*3/uL (ref 1.7–7.7)
Neutrophils Relative %: 92 %
Platelets: 152 10*3/uL (ref 150–400)
RBC: 3.5 MIL/uL — ABNORMAL LOW (ref 3.87–5.11)
RDW: 14 % (ref 11.5–15.5)
WBC: 8.3 10*3/uL (ref 4.0–10.5)
nRBC: 0 % (ref 0.0–0.2)

## 2023-08-24 LAB — COMPREHENSIVE METABOLIC PANEL
ALT: 9 U/L (ref 0–44)
AST: 17 U/L (ref 15–41)
Albumin: 2.9 g/dL — ABNORMAL LOW (ref 3.5–5.0)
Alkaline Phosphatase: 31 U/L — ABNORMAL LOW (ref 38–126)
Anion gap: 8 (ref 5–15)
BUN: 11 mg/dL (ref 6–20)
CO2: 24 mmol/L (ref 22–32)
Calcium: 8 mg/dL — ABNORMAL LOW (ref 8.9–10.3)
Chloride: 106 mmol/L (ref 98–111)
Creatinine, Ser: 1.05 mg/dL — ABNORMAL HIGH (ref 0.44–1.00)
GFR, Estimated: >60 mL/min (ref >60)
Glucose, Bld: 179 mg/dL — ABNORMAL HIGH (ref 70–99)
Potassium: 3.1 mmol/L — ABNORMAL LOW (ref 3.5–5.1)
Sodium: 138 mmol/L (ref 135–145)
Total Bilirubin: 0.5 mg/dL (ref 0.0–1.2)
Total Protein: 6 g/dL — ABNORMAL LOW (ref 6.5–8.1)

## 2023-08-24 LAB — MAGNESIUM: Magnesium: 1.7 mg/dL (ref 1.7–2.4)

## 2023-08-24 LAB — PROTIME-INR
INR: 1.5 — ABNORMAL HIGH (ref 0.8–1.2)
Prothrombin Time: 17.8 s — ABNORMAL HIGH (ref 11.4–15.2)

## 2023-08-24 MED ORDER — ORAL CARE MOUTH RINSE: 15.0000 mL | OROMUCOSAL | Status: DC | PRN

## 2023-08-24 MED ORDER — CHLORHEXIDINE GLUCONATE CLOTH 2 % EX PADS
6.0000 | MEDICATED_PAD | Freq: Every day | CUTANEOUS | Status: DC
  Administered 2023-08-24 – 2023-08-25 (×2): 6 via TOPICAL

## 2023-08-24 MED ORDER — POTASSIUM CHLORIDE CRYS ER 20 MEQ PO TBCR
40.0000 meq | EXTENDED_RELEASE_TABLET | ORAL | Status: AC
  Administered 2023-08-24 (×2): 40 meq via ORAL
  Filled 2023-08-24 (×2): qty 2

## 2023-08-24 MED ORDER — LACTATED RINGERS IV BOLUS
1000.0000 mL | Freq: Once | INTRAVENOUS | Status: AC
  Administered 2023-08-24: 1000 mL via INTRAVENOUS

## 2023-08-24 MED ORDER — SODIUM CHLORIDE 0.9 % IV SOLN: INTRAVENOUS | Status: DC

## 2023-08-24 NOTE — Progress Notes (Signed)
 Patient arrived to PACU from OR awake and oriented x4. She denied any pain or distress. 2325 Dr. Arrie Aran at bedside to round on patient and speak with her about the procedure. I informed him of her blood pressure, 93/50 and he stated he was "good with those pressures".  Patient continued to deny pain, she tolerated ice water and conversed with no issues. Report called to patient's receiving nurse Darrell at 2340. I informed him that I had spoken with Dr. Desmond Lope regarding her pressures and that he stated the were acceptable and that he was agreeable to transfer to 21M at this time. I informed RN Darrell that I would get another set of vitals and complete another assessment and then I would bring her to the floor. Her subsequent blood pressures were slightly lower so I placed a call to Dr. Desmond Lope to insure that he was still agreeable to her transfer to the floor as her bp was now 93/47 (map of 37). He again confirmed that he found her pressure to be acceptable at this time and he confirmed that he was ok with discharge to 21M. Patient tolerated transfer to 21M without complications. We were met in room by RN, Madaline Guthrie who received an updated report that included a second approval from Dr. Desmond Lope for patient to transfer to room. Patients mother was at bedside. Patient reported no pain, questions or concerns and RN Lowella Bandy denied need for further assistance.

## 2023-08-24 NOTE — Plan of Care (Signed)
 roblem: Health Behavior/Discharge Planning: Goal: Ability to manage health-related needs will improve Outcome: Progressing   Problem: Clinical Measurements: Goal: Ability to maintain clinical measurements within normal limits will improve Outcome: Progressing Goal: Will remain free from infection Outcome: Progressing Goal: Diagnostic test results will improve Outcome: Progressing Goal: Respiratory complications will improve Outcome: Progressing Goal: Cardiovascular complication will be avoided Outcome: Progressing   Problem: Activity: Goal: Risk for activity intolerance will decrease Outcome: Progressing   Problem: Nutrition: Goal: Adequate nutrition will be maintained Outcome: Progressing   Problem: Coping: Goal: Level of anxiety will decrease Outcome: Progressing   Problem: Elimination: Goal: Will not experience complications related to bowel motility Outcome: Progressing Goal: Will not experience complications related to urinary retention Outcome: Progressing   Problem: Pain Managment: Goal: General experience of comfort will improve and/or be controlled Outcome: Progressing   Problem: Safety: Goal: Ability to remain free from injury will improve Outcome: Progressing   Problem: Skin Integrity: Goal: Risk for impaired skin integrity will decrease Outcome: Progressing

## 2023-08-24 NOTE — Plan of Care (Signed)

## 2023-08-24 NOTE — Progress Notes (Signed)
 Lorraine Barker BP is 88/60. After her procedure earlier this morning her BP was 95/50. She is getting Lactated Ringer 125 ml/hr. She is A=Ox4. Denies Headache, dizziness and any sensation of passing out. MD on call notified.

## 2023-08-24 NOTE — Anesthesia Postprocedure Evaluation (Signed)
 Anesthesia Post Note  Patient: Lorraine Barker  Procedure(s) Performed: RETROGRADE CYSTOSCOPY WITH RIGHT URETERAL STENT PLACEMENT (Right: Ureter)     Patient location during evaluation: PACU Anesthesia Type: General Level of consciousness: awake and alert Pain management: pain level controlled Vital Signs Assessment: post-procedure vital signs reviewed and stable Respiratory status: spontaneous breathing, nonlabored ventilation, respiratory function stable and patient connected to nasal cannula oxygen Cardiovascular status: blood pressure returned to baseline and stable Postop Assessment: no apparent nausea or vomiting Anesthetic complications: no   No notable events documented.  Last Vitals:  Vitals:   08/23/23 2351 08/24/23 0032  BP: (!) 93/47 (!) 90/51  Pulse: (!) 104 95  Resp: 17 18  Temp: 37.6 C 37.1 C  SpO2: 96% 98%    Last Pain:  Vitals:   08/24/23 0032  TempSrc: Oral  PainSc:                  Collene Schlichter

## 2023-08-24 NOTE — Plan of Care (Signed)
  Problem: Clinical Measurements: Goal: Respiratory complications will improve Outcome: Progressing   Problem: Activity: Goal: Risk for activity intolerance will decrease Outcome: Progressing   Problem: Nutrition: Goal: Adequate nutrition will be maintained Outcome: Progressing   Problem: Coping: Goal: Level of anxiety will decrease Outcome: Progressing   Problem: Elimination: Goal: Will not experience complications related to bowel motility Outcome: Progressing   Problem: Pain Managment: Goal: General experience of comfort will improve and/or be controlled Outcome: Progressing

## 2023-08-24 NOTE — Progress Notes (Signed)
 1 Day Post-Op Subjective: NAEON. Pt feels back to baseline. She was accompanied by her father.   Objective: Vital signs in last 24 hours: Temp:  [97.7 F (36.5 C)-100.8 F (38.2 C)] 97.7 F (36.5 C) (02/25 0805) Pulse Rate:  [68-147] 68 (02/25 0805) Resp:  [15-22] 17 (02/25 0805) BP: (88-107)/(47-70) 101/70 (02/25 0805) SpO2:  [95 %-100 %] 100 % (02/25 0805) Weight:  [61 kg-61.2 kg] 61 kg (02/25 0500)  Assessment/Plan: #right ureteral stone S/p cystoscopy with right ureteral stent placement w/ Dr. Ronne Binning 2/24  Definitive stone mgmt on an outpt basis. Both of her parents report to be patients of [facility]. They will call for follow up if they have not been contacted within the week.   Leukocytosis has resolved. Scr essentially at baseline. Ok to discharge from a Urologic perspective.   Intake/Output from previous day: 02/24 0701 - 02/25 0700 In: 2075 [P.O.:75; I.V.:700; IV Piggyback:1300] Out: 150 [Urine:150]  Intake/Output this shift: Total I/O In: 240 [P.O.:240] Out: 700 [Urine:700]  Physical Exam:  General: Alert and oriented CV: No cyanosis Lungs: equal chest rise   Lab Results: Recent Labs    08/23/23 1405 08/24/23 0437  HGB 10.5* 9.1*  HCT 33.3* 29.0*   BMET Recent Labs    08/23/23 1405 08/24/23 0437  NA 134* 138  K 3.5 3.1*  CL 101 106  CO2 24 24  GLUCOSE 130* 179*  BUN 10 11  CREATININE 0.93 1.05*  CALCIUM 8.7* 8.0*     Studies/Results: DG C-Arm 1-60 Min-No Report Result Date: 08/23/2023 Fluoroscopy was utilized by the requesting physician.  No radiographic interpretation.   CT ABDOMEN PELVIS W CONTRAST Result Date: 08/23/2023 CLINICAL DATA:  Abdominal and flank pain EXAM: CT ABDOMEN AND PELVIS WITH CONTRAST TECHNIQUE: Multidetector CT imaging of the abdomen and pelvis was performed using the standard protocol following bolus administration of intravenous contrast. RADIATION DOSE REDUCTION: This exam was performed according to  the departmental dose-optimization program which includes automated exposure control, adjustment of the mA and/or kV according to patient size and/or use of iterative reconstruction technique. CONTRAST:  80mL OMNIPAQUE IOHEXOL 300 MG/ML  SOLN COMPARISON:  06/27/2023 FINDINGS: Lower chest: Dependent subsegmental atelectasis in the posterior basal segments of both lower lobes. Hepatobiliary: Unremarkable Pancreas: Unremarkable Spleen: Unremarkable Adrenals/Urinary Tract: Both adrenal glands appear normal. Moderate right hydronephrosis with perirenal and periureteral stranding along with moderate right hydroureter extending to a 0.8 cm distal ureteral calculus located about 5 cm proximal to the right UVJ. Mildly delayed enhancement of the right kidney compatible with obstruction. Faint cortical hypodensity posterolaterally along the right kidney, probably related to the obstruction, cannot completely exclude pyelonephritis. No abnormal gas or abscess. Stomach/Bowel: Appendix not well seen.  No definite acute findings. Vascular/Lymphatic: Unremarkable Reproductive: 3.0 by 1.7 cm right ovarian cystic lesion. Other: No supplemental non-categorized findings. Musculoskeletal: Unremarkable IMPRESSION: 1. Moderate right hydronephrosis and hydroureter extending to a 0.8 cm distal ureteral calculus located about 5 cm proximal to the right UVJ. Associated delayed enhancement the right kidney. 2. Faint cortical hypodensity posterolaterally along the right kidney, probably related to the obstruction, cannot completely exclude pyelonephritis. 3. 3.0 by 1.7 cm right ovarian cystic lesion. No further imaging workup of this lesion is indicated. 4. Dependent subsegmental atelectasis in the posterior basal segments of both lower lobes. Electronically Signed   By: Gaylyn Rong M.D.   On: 08/23/2023 17:12      LOS: 1 day   Elmon Kirschner, NP [facility] Pager: [phone])  [phone]  08/24/2023, 12:24 PM

## 2023-08-24 NOTE — Progress Notes (Signed)
 PROGRESS NOTE    Lorraine Barker  VWU:[id number] DOB: [birth year] DOA: 08/23/2023 PCP: Rick Duff, PA-C   Brief Narrative:  33 year old female with history of renal stone, depression, chronic normocytic anemia presented with right flank pain with nausea and vomiting.  On presentation, she had a temperature of 99.9 with tachycardia.  WBC of 15,300.  UA suggestive of UTI and hematuria.  COVID-19/influenza/RSV PCR negative.  CT of abdomen and pelvis with contrast showed moderate right-sided hydronephrosis and right hydroureter with ureteral stone along with possible right pyelonephritis.  She was started on IV fluids and antibiotics.  She underwent cystoscopy and right ureteral stent placement by urology on 08/23/2023.  Assessment & Plan:   Sepsis: Present on admission Acute pyelonephritis due to obstructing right ureterolithiasis causing right-sided hydronephrosis and right hydroureter -Presented with right flank pain, tachycardic, tachycardia with findings suggestive of acute pyelonephritis and leukocytosis. -Imaging as above. -underwent cystoscopy and right ureteral stent placement by urology on 08/23/2023.  Outpatient follow-up with urology. -Follow cultures. -Blood pressure intermittently on the lower side.  Continue IV fluids.  Leukocytosis -Resolved  Hypokalemia -Replace.  Repeat a.m. labs  Chronic normocytic anemia -Questionable cause.  Hemoglobin currently stable.  Monitor intermittently  Hyponatremia -Resolved  Depression -Continue Lexapro   DVT prophylaxis: SCDs Code Status: Full Family Communication: Father at bedside Disposition Plan: Status is: Inpatient Remains inpatient appropriate because: Of severity of illness with need for IV fluids and antibiotics.    Consultants: Urology  Procedures: As above  Antimicrobials: Rocephin from 08/23/2023 onwards   Subjective: Patient seen and examined at bedside.  Feels slightly better.  No current nausea, fever,  chest pain reported.  Objective: Vitals:   08/23/23 2351 08/24/23 0032 08/24/23 0500 08/24/23 0620  BP: (!) 93/47 (!) 90/51  (!) 88/60  Pulse: (!) 104 95  71  Resp: 17 18  18   Temp: 99.7 F (37.6 C) 98.7 F (37.1 C)  98.4 F (36.9 C)  TempSrc:  Oral    SpO2: 96% 98%  99%  Weight:   61 kg   Height:        Intake/Output Summary (Last 24 hours) at 08/24/2023 0722 Last data filed at 08/24/2023 0708 Gross per 24 hour  Intake 2075 ml  Output 400 ml  Net 1675 ml   Filed Weights   08/23/23 1450 08/24/23 0500  Weight: 61.2 kg 61 kg    Examination:  General exam: Appears calm and comfortable.  On room air. Respiratory system: Bilateral decreased breath sounds at bases, no wheezing Cardiovascular system: S1 & S2 heard, Rate controlled Gastrointestinal system: Abdomen is nondistended, soft and nontender. Normal bowel sounds heard. Genitourinary: Mild right flank tenderness present Extremities: No cyanosis, clubbing, edema  Central nervous system: Alert and oriented. No focal neurological deficits. Moving extremities Skin: No rashes, lesions or ulcers Psychiatry: Judgement and insight appear normal. Mood & affect appropriate.     Data Reviewed: I have personally reviewed following labs and imaging studies  CBC: Recent Labs  Lab 08/23/23 1405 08/24/23 0437  WBC 15.3* 8.3  NEUTROABS 13.2* 7.7  HGB 10.5* 9.1*  HCT 33.3* 29.0*  MCV 82.6 82.9  PLT 203 152   Basic Metabolic Panel: Recent Labs  Lab 08/23/23 1405 08/24/23 0437  NA 134* 138  K 3.5 3.1*  CL 101 106  CO2 24 24  GLUCOSE 130* 179*  BUN 10 11  CREATININE 0.93 1.05*  CALCIUM 8.7* 8.0*  MG  --  1.7   GFR: Estimated Creatinine Clearance: 63.9  mL/min (A) (by C-G formula based on SCr of 1.05 mg/dL (H)). Liver Function Tests: Recent Labs  Lab 08/23/23 1405 08/24/23 0437  AST 12* 17  ALT 6 9  ALKPHOS 40 31*  BILITOT 0.7 0.5  PROT 7.3 6.0*  ALBUMIN 4.2 2.9*   No results for input(s): "LIPASE",  "AMYLASE" in the last 168 hours. No results for input(s): "AMMONIA" in the last 168 hours. Coagulation Profile: Recent Labs  Lab 08/24/23 0437  INR 1.5*   Cardiac Enzymes: No results for input(s): "CKTOTAL", "CKMB", "CKMBINDEX", "TROPONINI" in the last 168 hours. BNP (last 3 results) No results for input(s): "PROBNP" in the last 8760 hours. HbA1C: No results for input(s): "HGBA1C" in the last 72 hours. CBG: No results for input(s): "GLUCAP" in the last 168 hours. Lipid Profile: No results for input(s): "CHOL", "HDL", "LDLCALC", "TRIG", "CHOLHDL", "LDLDIRECT" in the last 72 hours. Thyroid Function Tests: No results for input(s): "TSH", "T4TOTAL", "FREET4", "T3FREE", "THYROIDAB" in the last 72 hours. Anemia Panel: No results for input(s): "VITAMINB12", "FOLATE", "FERRITIN", "TIBC", "IRON", "RETICCTPCT" in the last 72 hours. Sepsis Labs: Recent Labs  Lab 08/23/23 1405  LATICACIDVEN 0.8    Recent Results (from the past 240 hours)  Blood culture (routine x 2)     Status: None (Preliminary result)   Collection Time: 08/23/23  2:49 PM   Specimen: BLOOD  Result Value Ref Range Status   Specimen Description   Final    BLOOD LEFT ANTECUBITAL Performed at [facility], [address] [place], [place] [address]    Special Requests   Final    Blood Culture results may not be optimal due to an inadequate volume of blood received in culture bottles BOTTLES DRAWN AEROBIC AND ANAEROBIC Performed at [facility], [address] [place], [place] [address]    Culture   Final    NO GROWTH < 12 HOURS Performed at [facility], [address], [place], [place] [address]    Report Status PENDING  Incomplete  Resp panel by RT-PCR (RSV, Flu A&B, Covid) Anterior Nasal Swab     Status: None   Collection Time: 08/23/23  2:54 PM   Specimen: Anterior Nasal Swab  Result Value Ref Range Status   SARS Coronavirus 2 by RT PCR NEGATIVE NEGATIVE Final     Comment: (NOTE) SARS-CoV-2 target nucleic acids are NOT DETECTED.  The SARS-CoV-2 RNA is generally detectable in upper respiratory specimens during the acute phase of infection. The lowest concentration of SARS-CoV-2 viral copies this assay can detect is 138 copies/mL. A negative result does not preclude SARS-Cov-2 infection and should not be used as the sole basis for treatment or other patient management decisions. A negative result may occur with  improper specimen collection/handling, submission of specimen other than nasopharyngeal swab, presence of viral mutation(s) within the areas targeted by this assay, and inadequate number of viral copies(<138 copies/mL). A negative result must be combined with clinical observations, patient history, and epidemiological information. The expected result is Negative.  Fact Sheet for Patients:  BloggerCourse.com  Fact Sheet for Healthcare Providers:  SeriousBroker.it  This test is no t yet approved or cleared by the [place] FDA and  has been authorized for detection and/or diagnosis of SARS-CoV-2 by FDA under an Emergency Use Authorization (EUA). This EUA will remain  in effect (meaning this test can be used) for the duration of the COVID-19 declaration under Section 564(b)(1) of the Act, 21 U.S.C.section 360bbb-3(b)(1), unless the authorization is terminated  or revoked sooner.       Influenza A by PCR NEGATIVE NEGATIVE Final   Influenza B by PCR NEGATIVE NEGATIVE Final    Comment: (NOTE) The Xpert Xpress SARS-CoV-2/FLU/RSV plus assay is intended as an aid in the diagnosis of influenza from Nasopharyngeal swab specimens and should not be used as a sole basis for treatment. Nasal washings and aspirates are unacceptable for Xpert Xpress SARS-CoV-2/FLU/RSV testing.  Fact Sheet for Patients: BloggerCourse.com  Fact Sheet for Healthcare  Providers: SeriousBroker.it  This test is not yet approved or cleared by the [place] FDA and has been authorized for detection and/or diagnosis of SARS-CoV-2 by FDA under an Emergency Use Authorization (EUA). This EUA will remain in effect (meaning this test can be used) for the duration of the COVID-19 declaration under Section 564(b)(1) of the Act, 21 U.S.C. section 360bbb-3(b)(1), unless the authorization is terminated or revoked.     Resp Syncytial Virus by PCR NEGATIVE NEGATIVE Final    Comment: (NOTE) Fact Sheet for Patients: BloggerCourse.com  Fact Sheet for Healthcare Providers: SeriousBroker.it  This test is not yet approved or cleared by the [place] FDA and has been authorized for detection and/or diagnosis of SARS-CoV-2 by FDA under an Emergency Use Authorization (EUA). This EUA will remain in effect (meaning this test can be used) for the duration of the COVID-19 declaration under Section 564(b)(1) of the Act, 21 U.S.C. section 360bbb-3(b)(1), unless the authorization is terminated or revoked.  Performed at [facility], [address] [place], [place] [address]   Blood culture (routine x 2)     Status: None (Preliminary result)   Collection Time: 08/23/23  2:54 PM   Specimen: BLOOD  Result Value Ref Range Status   Specimen Description   Final    BLOOD RIGHT ANTECUBITAL Performed at [facility], [address], [place], [place] [address]    Special Requests   Final    Blood Culture results may not be optimal due to an inadequate volume of blood received in culture bottles BOTTLES DRAWN AEROBIC AND ANAEROBIC Performed at [facility], [address] [place], [place] [address]    Culture   Final    NO GROWTH < 12 HOURS Performed at [facility], [address], [place], [place] [address]    Report Status  PENDING  Incomplete         Radiology Studies: DG C-Arm 1-60 Min-No Report Result Date: 08/23/2023 Fluoroscopy was utilized by the requesting physician.  No radiographic interpretation.   CT ABDOMEN PELVIS W CONTRAST Result Date: 08/23/2023 CLINICAL DATA:  Abdominal and flank pain EXAM: CT ABDOMEN AND PELVIS WITH CONTRAST TECHNIQUE: Multidetector CT imaging of the abdomen and pelvis was performed using the standard protocol following bolus administration of intravenous contrast. RADIATION DOSE REDUCTION: This exam was performed according to the departmental dose-optimization program which includes automated exposure control, adjustment of the mA and/or kV according to patient size and/or use of iterative reconstruction technique. CONTRAST:  80mL OMNIPAQUE IOHEXOL 300 MG/ML  SOLN COMPARISON:  06/27/2023 FINDINGS: Lower chest: Dependent subsegmental atelectasis in the posterior basal segments of both lower lobes. Hepatobiliary: Unremarkable Pancreas: Unremarkable Spleen: Unremarkable Adrenals/Urinary Tract: Both adrenal glands appear normal. Moderate right hydronephrosis with perirenal and periureteral stranding along with moderate right hydroureter extending to a 0.8 cm distal ureteral calculus located about 5 cm proximal to the right UVJ. Mildly delayed enhancement of the right kidney compatible with obstruction. Faint cortical hypodensity posterolaterally along the right  kidney, probably related to the obstruction, cannot completely exclude pyelonephritis. No abnormal gas or abscess. Stomach/Bowel: Appendix not well seen.  No definite acute findings. Vascular/Lymphatic: Unremarkable Reproductive: 3.0 by 1.7 cm right ovarian cystic lesion. Other: No supplemental non-categorized findings. Musculoskeletal: Unremarkable IMPRESSION: 1. Moderate right hydronephrosis and hydroureter extending to a 0.8 cm distal ureteral calculus located about 5 cm proximal to the right UVJ. Associated delayed enhancement the  right kidney. 2. Faint cortical hypodensity posterolaterally along the right kidney, probably related to the obstruction, cannot completely exclude pyelonephritis. 3. 3.0 by 1.7 cm right ovarian cystic lesion. No further imaging workup of this lesion is indicated. 4. Dependent subsegmental atelectasis in the posterior basal segments of both lower lobes. Electronically Signed   By: Gaylyn Rong M.D.   On: 08/23/2023 17:12        Scheduled Meds:  escitalopram  5 mg Oral Daily   tamsulosin  0.4 mg Oral Daily   Continuous Infusions:  cefTRIAXone (ROCEPHIN)  IV     lactated ringers 500 mL/hr at 08/24/23 1610          Glade Lloyd, MD [facility] 08/24/2023, 7:22 AM

## 2023-08-24 NOTE — TOC CM/SW Note (Signed)
 Transition of Care [facility]) - Inpatient Brief Assessment   Patient Details  Name: Lorraine Barker MRN: [id number] Date of Birth: [birth year]  Transition of [facility]) CM/SW Contact:    Tom-Johnson, Hershal Coria, RN Phone Number: 08/24/2023, 2:22 PM   Clinical Narrative:  Patient presented to the ED from [facility] with Rt Flank Pain. Admitted with Sepsis 2/2 Obstructing Rt Ureterolithiasis/ Rt-sided Pyelonephritis. On IV abx. Patient with Hx of recent Kidney Stone.  Patient underwent .  From home with parents and son. Independent with care, employed and drive self.   PCP is Rick Duff, PA and uses [facility] in [place].   No TOC needs or recommendations noted at this time.  Patient not Medically ready for discharge.  CM will continue to follow as patient progresses with care towards discharge.                Transition of Care Asessment: Insurance and Status: Insurance coverage has been reviewed Patient has primary care physician: Yes Home environment has been reviewed: Yes Prior level of function:: Independent Prior/Current Home Services: No current home services Social Drivers of Health Review: SDOH reviewed no interventions necessary Readmission risk has been reviewed: Yes Transition of care needs: no transition of care needs at this time

## 2023-08-25 ENCOUNTER — Other Ambulatory Visit (HOSPITAL_COMMUNITY): Payer: Self-pay

## 2023-08-25 DIAGNOSIS — N132 Hydronephrosis with renal and ureteral calculous obstruction: Secondary | ICD-10-CM | POA: Diagnosis not present

## 2023-08-25 LAB — URINE CULTURE: Culture: >=100000 — AB

## 2023-08-25 LAB — CBC WITH DIFFERENTIAL/PLATELET
Abs Immature Granulocytes: 0.03 10*3/uL (ref 0.00–0.07)
Basophils Absolute: 0 10*3/uL (ref 0.0–0.1)
Basophils Relative: 0 %
Eosinophils Absolute: 0.1 10*3/uL (ref 0.0–0.5)
Eosinophils Relative: 1 %
HCT: 27.4 % — ABNORMAL LOW (ref 36.0–46.0)
Hemoglobin: 8.5 g/dL — ABNORMAL LOW (ref 12.0–15.0)
Immature Granulocytes: 0 %
Lymphocytes Relative: 15 %
Lymphs Abs: 1.2 10*3/uL (ref 0.7–4.0)
MCH: 25.8 pg — ABNORMAL LOW (ref 26.0–34.0)
MCHC: 31 g/dL (ref 30.0–36.0)
MCV: 83.3 fL (ref 80.0–100.0)
Monocytes Absolute: 0.9 10*3/uL (ref 0.1–1.0)
Monocytes Relative: 11 %
Neutro Abs: 5.8 10*3/uL (ref 1.7–7.7)
Neutrophils Relative %: 73 %
Platelets: 148 10*3/uL — ABNORMAL LOW (ref 150–400)
RBC: 3.29 MIL/uL — ABNORMAL LOW (ref 3.87–5.11)
RDW: 13.7 % (ref 11.5–15.5)
WBC: 8 10*3/uL (ref 4.0–10.5)
nRBC: 0 % (ref 0.0–0.2)

## 2023-08-25 LAB — BASIC METABOLIC PANEL
Anion gap: 3 — ABNORMAL LOW (ref 5–15)
BUN: 11 mg/dL (ref 6–20)
CO2: 21 mmol/L — ABNORMAL LOW (ref 22–32)
Calcium: 7.5 mg/dL — ABNORMAL LOW (ref 8.9–10.3)
Chloride: 113 mmol/L — ABNORMAL HIGH (ref 98–111)
Creatinine, Ser: 0.79 mg/dL (ref 0.44–1.00)
GFR, Estimated: >60 mL/min (ref >60)
Glucose, Bld: 96 mg/dL (ref 70–99)
Potassium: 3.4 mmol/L — ABNORMAL LOW (ref 3.5–5.1)
Sodium: 137 mmol/L (ref 135–145)

## 2023-08-25 LAB — HEMOGLOBIN AND HEMATOCRIT, BLOOD
HCT: 27.5 % — ABNORMAL LOW (ref 36.0–46.0)
Hemoglobin: 8.4 g/dL — ABNORMAL LOW (ref 12.0–15.0)

## 2023-08-25 LAB — MAGNESIUM: Magnesium: 1.6 mg/dL — ABNORMAL LOW (ref 1.7–2.4)

## 2023-08-25 MED ORDER — AMOXICILLIN 500 MG PO CAPS
500.0000 mg | ORAL_CAPSULE | Freq: Three times a day (TID) | ORAL | 0 refills | Status: AC
  Filled 2023-08-25: qty 12, 4d supply, fill #0

## 2023-08-25 MED ORDER — POTASSIUM CHLORIDE CRYS ER 20 MEQ PO TBCR
40.0000 meq | EXTENDED_RELEASE_TABLET | Freq: Once | ORAL | Status: AC
Start: 2023-08-25 — End: 2023-08-25
  Administered 2023-08-25: 40 meq via ORAL
  Filled 2023-08-25: qty 2

## 2023-08-25 NOTE — Discharge Summary (Signed)
 Physician Discharge Summary   Patient: Lorraine Barker MRN: [id number] DOB: [birth year]  Admit date:     08/23/2023  Discharge date: 08/28/23  Discharge Physician: Jacquelin Hawking, MD   PCP: Rick Duff, PA-C   Recommendations at discharge:  PCP visit for hospital follow-up Urology visit for stent follow-up Repeat CBC in 3-5 days  Discharge Diagnoses: Principal Problem:   Ureteral stone with hydronephrosis Active Problems:   Chronic anemia   Sepsis due to gram-negative UTI (HCC)   Sepsis secondary to UTI [facility])   Pyelonephritis   Acute right flank pain   Leukocytosis   Depression  Resolved Problems:   * No resolved hospital problems. *  Hospital Course: Lorraine Barker is a 33 y.o. female with a history of kidney stones, depression, anemia.  Patient presented secondary to right flank pain and was found to have a UTI with right ureterolithiasis with resulting right hydronephrosis. Ceftriaxone started. Urology consulted and placed a right ureteral stent. Urine culture isolated proteus mirabilis and patient discharged on amoxicillin. Patient to follow-up with urology as an outpatient.  Assessment and Plan:  Sepsis Present on admission. Secondary to acute pyelonephritis with obstructing ureteral stone. Blood cultures with no growth.  Acute pyelonephritis Patient managed empirically on Ceftriaxone. Urine culture significant for proteus mirabilis. Ceftriaxone transitioned to amoxicillin for discharge.  Right ureterolithiasis Right-sided hydronephrosis Right-sided hydroureter Urology consulted and placed a right ureteral stent on 2/24. Patient to follow-up with urology as an outpatient.  Hypokalemia Potassium down to 3.1. Patient treated with potassium supplementation.  Chronic normocytic anemia Acute blood loss anemia Likely renal etiology for bleeding. Normal bilirubin. Stabilized. Repeat CBC as an outpatient.  Hyponatremia Mild. Patient is on Lexapro, but this was continued  and not likely contributing. Hyponatremia resolved.  Depression Continue Lexapro.   Consultants: Urology Procedures performed:  Urology procedure cystoscopy right retrograde pyelography Intraoperative fluoroscopy, under one hour, with interpretation right 6 x 24 JJ stent placement  Disposition: Home Diet recommendation: Regular diet   DISCHARGE MEDICATION: Allergies as of 08/25/2023   No Known Allergies      Medication List     STOP taking these medications    sulfamethoxazole-trimethoprim 800-160 MG tablet Commonly known as: BACTRIM DS       TAKE these medications    amoxicillin 500 MG capsule Commonly known as: AMOXIL Take 1 capsule (500 mg total) by mouth 3 (three) times daily for 4 days. Start taking on: August 26, 2023   escitalopram 5 MG tablet Commonly known as: LEXAPRO Take 5 mg by mouth daily.   ketorolac 10 MG tablet Commonly known as: TORADOL Take 10 mg by mouth every 6 (six) hours as needed for moderate pain (pain score 4-6).   ondansetron 4 MG disintegrating tablet Commonly known as: ZOFRAN-ODT Take 4 mg by mouth every 8 (eight) hours as needed for vomiting or nausea.   tamsulosin 0.4 MG Caps capsule Commonly known as: FLOMAX Take 0.4 mg by mouth daily.   traMADol 50 MG tablet Commonly known as: ULTRAM Take 50 mg by mouth every 6 (six) hours as needed for moderate pain (pain score 4-6).        Follow-up Information     Rick Duff, PA-C. Schedule an appointment as soon as possible for a visit in 1 week(s).   Specialty: Physician Assistant Why: For hospital follow-up Contact information: [address] [place] [address] [phone]         Malen Gauze, MD. Schedule an appointment as soon as  possible for a visit in 1 week(s).   Specialty: Urology Why: For hospital follow-up Contact information: [address]  [address] [place] [address] [phone]                Discharge  Exam: BP 128/73 (BP Location: Right Arm)   Pulse 90   Temp 98.4 F (36.9 C) (Oral)   Resp 18   Ht 5' 3.5" (1.613 m)   Wt 61.1 kg   SpO2 100%   BMI 23.49 kg/m   General exam: Appears calm and comfortable  Respiratory system: Respiratory effort normal. Gastrointestinal system: Abdomen is nondistended, soft and mildly tender around right flank.  Condition at discharge: stable  The results of significant diagnostics from this hospitalization (including imaging, microbiology, ancillary and laboratory) are listed below for reference.   Imaging Studies: DG C-Arm 1-60 Min-No Report Result Date: 08/23/2023 Fluoroscopy was utilized by the requesting physician.  No radiographic interpretation.   CT ABDOMEN PELVIS W CONTRAST Result Date: 08/23/2023 CLINICAL DATA:  Abdominal and flank pain EXAM: CT ABDOMEN AND PELVIS WITH CONTRAST TECHNIQUE: Multidetector CT imaging of the abdomen and pelvis was performed using the standard protocol following bolus administration of intravenous contrast. RADIATION DOSE REDUCTION: This exam was performed according to the departmental dose-optimization program which includes automated exposure control, adjustment of the mA and/or kV according to patient size and/or use of iterative reconstruction technique. CONTRAST:  80mL OMNIPAQUE IOHEXOL 300 MG/ML  SOLN COMPARISON:  06/27/2023 FINDINGS: Lower chest: Dependent subsegmental atelectasis in the posterior basal segments of both lower lobes. Hepatobiliary: Unremarkable Pancreas: Unremarkable Spleen: Unremarkable Adrenals/Urinary Tract: Both adrenal glands appear normal. Moderate right hydronephrosis with perirenal and periureteral stranding along with moderate right hydroureter extending to a 0.8 cm distal ureteral calculus located about 5 cm proximal to the right UVJ. Mildly delayed enhancement of the right kidney compatible with obstruction. Faint cortical hypodensity posterolaterally along the right kidney, probably  related to the obstruction, cannot completely exclude pyelonephritis. No abnormal gas or abscess. Stomach/Bowel: Appendix not well seen.  No definite acute findings. Vascular/Lymphatic: Unremarkable Reproductive: 3.0 by 1.7 cm right ovarian cystic lesion. Other: No supplemental non-categorized findings. Musculoskeletal: Unremarkable IMPRESSION: 1. Moderate right hydronephrosis and hydroureter extending to a 0.8 cm distal ureteral calculus located about 5 cm proximal to the right UVJ. Associated delayed enhancement the right kidney. 2. Faint cortical hypodensity posterolaterally along the right kidney, probably related to the obstruction, cannot completely exclude pyelonephritis. 3. 3.0 by 1.7 cm right ovarian cystic lesion. No further imaging workup of this lesion is indicated. 4. Dependent subsegmental atelectasis in the posterior basal segments of both lower lobes. Electronically Signed   By: Gaylyn Rong M.D.   On: 08/23/2023 17:12    Microbiology: Results for orders placed or performed during the hospital encounter of 08/23/23  Urine Culture     Status: Abnormal   Collection Time: 08/23/23  2:05 PM   Specimen: Urine, Random  Result Value Ref Range Status   Specimen Description   Final    URINE, RANDOM Performed at [facility], [address] [place], [place] [address]    Special Requests   Final    NONE Reflexed from [id number] Performed at [facility], [address], [place], [place] [address]    Culture >=100,000 COLONIES/mL PROTEUS MIRABILIS (A)  Final   Report Status 08/25/2023 FINAL  Final   Organism ID, Bacteria PROTEUS MIRABILIS (A)  Final      Susceptibility   Proteus mirabilis -  MIC*    AMPICILLIN <=2 SENSITIVE Sensitive     CEFAZOLIN <=4 SENSITIVE Sensitive     CEFTRIAXONE <=0.25 SENSITIVE Sensitive     CIPROFLOXACIN <=0.25 SENSITIVE Sensitive     GENTAMICIN <=1 SENSITIVE Sensitive     IMIPENEM 2 SENSITIVE Sensitive      NITROFURANTOIN 128 RESISTANT Resistant     TRIMETH/SULFA <=20 SENSITIVE Sensitive     AMPICILLIN/SULBACTAM <=2 SENSITIVE Sensitive     PIP/TAZO <=4 SENSITIVE Sensitive ug/mL    * >=100,000 COLONIES/mL PROTEUS MIRABILIS  Blood culture (routine x 2)     Status: None (Preliminary result)   Collection Time: 08/23/23  2:49 PM   Specimen: BLOOD  Result Value Ref Range Status   Specimen Description   Final    BLOOD LEFT ANTECUBITAL Performed at [facility], [address] [place], [place] [address]    Special Requests   Final    Blood Culture results may not be optimal due to an inadequate volume of blood received in culture bottles BOTTLES DRAWN AEROBIC AND ANAEROBIC Performed at [facility], [address] [place], [place] [address]    Culture   Final    NO GROWTH 2 DAYS Performed at [facility], [address], [place], [place] [address]    Report Status PENDING  Incomplete  Resp panel by RT-PCR (RSV, Flu A&B, Covid) Anterior Nasal Swab     Status: None   Collection Time: 08/23/23  2:54 PM   Specimen: Anterior Nasal Swab  Result Value Ref Range Status   SARS Coronavirus 2 by RT PCR NEGATIVE NEGATIVE Final    Comment: (NOTE) SARS-CoV-2 target nucleic acids are NOT DETECTED.  The SARS-CoV-2 RNA is generally detectable in upper respiratory specimens during the acute phase of infection. The lowest concentration of SARS-CoV-2 viral copies this assay can detect is 138 copies/mL. A negative result does not preclude SARS-Cov-2 infection and should not be used as the sole basis for treatment or other patient management decisions. A negative result may occur with  improper specimen collection/handling, submission of specimen other than nasopharyngeal swab, presence of viral mutation(s) within the areas targeted by this assay, and inadequate number of viral copies(<138 copies/mL). A negative result must be combined with clinical  observations, patient history, and epidemiological information. The expected result is Negative.  Fact Sheet for Patients:  BloggerCourse.com  Fact Sheet for Healthcare Providers:  SeriousBroker.it  This test is no t yet approved or cleared by the [place] FDA and  has been authorized for detection and/or diagnosis of SARS-CoV-2 by FDA under an Emergency Use Authorization (EUA). This EUA will remain  in effect (meaning this test can be used) for the duration of the COVID-19 declaration under Section 564(b)(1) of the Act, 21 U.S.C.section 360bbb-3(b)(1), unless the authorization is terminated  or revoked sooner.       Influenza A by PCR NEGATIVE NEGATIVE Final   Influenza B by PCR NEGATIVE NEGATIVE Final    Comment: (NOTE) The Xpert Xpress SARS-CoV-2/FLU/RSV plus assay is intended as an aid in the diagnosis of influenza from Nasopharyngeal swab specimens and should not be used as a sole basis for treatment. Nasal washings and aspirates are unacceptable for Xpert Xpress SARS-CoV-2/FLU/RSV testing.  Fact Sheet for Patients: BloggerCourse.com  Fact Sheet for Healthcare Providers: SeriousBroker.it  This test is not yet approved or cleared by the [place] FDA and has been authorized for detection and/or diagnosis of SARS-CoV-2 by FDA under an Emergency Use Authorization (EUA).  This EUA will remain in effect (meaning this test can be used) for the duration of the COVID-19 declaration under Section 564(b)(1) of the Act, 21 U.S.C. section 360bbb-3(b)(1), unless the authorization is terminated or revoked.     Resp Syncytial Virus by PCR NEGATIVE NEGATIVE Final    Comment: (NOTE) Fact Sheet for Patients: BloggerCourse.com  Fact Sheet for Healthcare Providers: SeriousBroker.it  This test is not yet approved or cleared by  the [place] FDA and has been authorized for detection and/or diagnosis of SARS-CoV-2 by FDA under an Emergency Use Authorization (EUA). This EUA will remain in effect (meaning this test can be used) for the duration of the COVID-19 declaration under Section 564(b)(1) of the Act, 21 U.S.C. section 360bbb-3(b)(1), unless the authorization is terminated or revoked.  Performed at [facility], [address] [place], [place] [address]   Blood culture (routine x 2)     Status: None (Preliminary result)   Collection Time: 08/23/23  2:54 PM   Specimen: BLOOD  Result Value Ref Range Status   Specimen Description   Final    BLOOD RIGHT ANTECUBITAL Performed at [facility], [address], [place], [place] [address]    Special Requests   Final    Blood Culture results may not be optimal due to an inadequate volume of blood received in culture bottles BOTTLES DRAWN AEROBIC AND ANAEROBIC Performed at [facility], [address] [place], [place] [address]    Culture   Final    NO GROWTH 2 DAYS Performed at [facility], [address], [place], [place] [address]    Report Status PENDING  Incomplete    Labs: CBC: Recent Labs  Lab 08/23/23 1405 08/24/23 0437 08/25/23 0359 08/25/23 1056  WBC 15.3* 8.3 8.0  --   NEUTROABS 13.2* 7.7 5.8  --   HGB 10.5* 9.1* 8.5* 8.4*  HCT 33.3* 29.0* 27.4* 27.5*  MCV 82.6 82.9 83.3  --   PLT 203 152 148*  --    Basic Metabolic Panel: Recent Labs  Lab 08/23/23 1405 08/24/23 0437 08/25/23 0359  NA 134* 138 137  K 3.5 3.1* 3.4*  CL 101 106 113*  CO2 24 24 21*  GLUCOSE 130* 179* 96  BUN 10 11 11   CREATININE 0.93 1.05* 0.79  CALCIUM 8.7* 8.0* 7.5*  MG  --  1.7 1.6*   Liver Function Tests: Recent Labs  Lab 08/23/23 1405 08/24/23 0437  AST 12* 17  ALT 6 9  ALKPHOS 40 31*  BILITOT 0.7 0.5  PROT 7.3 6.0*  ALBUMIN 4.2 2.9*    Discharge time spent: 35  minutes.  Signed: Jacquelin Hawking, MD [facility] 08/25/2023

## 2023-08-25 NOTE — Discharge Instructions (Signed)
 Lorraine Barker,  You were in the hospital with a urinary tract infection from a urinary stone. You had a stent placed and you were started on antibiotics. You have significantly improved. You will need to follow-up with the urologist for ongoing management. Please follow-up with your PCP as they can recheck your labs.

## 2023-08-25 NOTE — TOC Transition Note (Signed)
 Transition of Care [facility]) - Discharge Note   Patient Details  Name: Lorraine Barker MRN: [id number] Date of Birth: [birth year]  Transition [facility]) CM/SW Contact:  Tom-Johnson, Hershal Coria, RN Phone Number: 08/25/2023, 1:30 PM   Clinical Narrative:     Patient is scheduled for discharge today.  Readmission Risk Assessment done. Outpatient f/u, hospital f/u and discharge instructions on AVS. No TOC needs or recommendations noted. Mother, Raynelle Fanning to transport at discharge.  No further TOC needs noted.          Final next level of care: Home/Self Care Barriers to Discharge: Barriers Resolved   Patient Goals and CMS Choice Patient states their goals for this hospitalization and ongoing recovery are:: To return home CMS Medicare.gov Compare Post Acute Care list provided to:: Patient Choice offered to / list presented to : NA      Discharge Placement                Patient to be transferred to facility by: Mother Name of family member notified: Raynelle Fanning    Discharge Plan and Services Additional resources added to the After Visit Summary for                  DME Arranged: N/A DME Agency: NA       HH Arranged: NA HH Agency: NA        Social Drivers of Health (SDOH) Interventions SDOH Screenings   Food Insecurity: No Food Insecurity (08/24/2023)  Housing: Low Risk  (08/24/2023)  Transportation Needs: No Transportation Needs (08/24/2023)  Utilities: Not At Risk (08/24/2023)  Tobacco Use: Low Risk  (08/23/2023)     Readmission Risk Interventions    08/24/2023    2:22 PM  Readmission Risk Prevention Plan  Post Dischage Appt Complete  Medication Screening Complete  Transportation Screening Complete

## 2023-08-27 ENCOUNTER — Other Ambulatory Visit: Payer: Self-pay | Admitting: Urology

## 2023-08-27 DIAGNOSIS — Z960 Presence of urogenital implants: Secondary | ICD-10-CM

## 2023-08-27 DIAGNOSIS — N12 Tubulo-interstitial nephritis, not specified as acute or chronic: Secondary | ICD-10-CM

## 2023-08-27 DIAGNOSIS — N201 Calculus of ureter: Secondary | ICD-10-CM

## 2023-08-27 DIAGNOSIS — A419 Sepsis, unspecified organism: Secondary | ICD-10-CM

## 2023-08-27 NOTE — Progress Notes (Signed)
 Name: Lorraine Barker DOB: [birth year] MRN: [id number]  Diagnoses: 1) Post-operative state  HPI: Lorraine Barker presents post-operatively.  - GU history includes:  1. Kidney stones. Passed 1 spontaneously in 2015. No prior stone surgeries.   She underwent the following procedure on 08/23/2023 by Dr. Ronne Binning for management of a 8 mm right mid ureteral stone with moderate hydronephrosis, pyelonephritis, and sepsis.  Preoperative diagnosis: right ureteral stone, sepsis   Postoperative diagnosis: Same   Procedure:  1. cystoscopy 2. right retrograde pyelography 3. Intraoperative fluoroscopy, under one hour, with interpretation 4. right 6 x 24 JJ stent placement  Postop course: She reports increased urinary urgency and frequency. Denies fevers, dysuria, gross hematuria, hesitancy, straining to void, or sensations of incomplete emptying. She denies significant flank pain or abdominal pain. She just completed the antibiotic course.   Medications: Current Outpatient Medications  Medication Sig Dispense Refill   amoxicillin (AMOXIL) 500 MG capsule Take 1 capsule (500 mg total) by mouth 3 (three) times daily for 4 days. 12 capsule 0   escitalopram (LEXAPRO) 5 MG tablet Take 5 mg by mouth daily.     ketorolac (TORADOL) 10 MG tablet Take 10 mg by mouth every 6 (six) hours as needed for moderate pain (pain score 4-6).     ondansetron (ZOFRAN-ODT) 4 MG disintegrating tablet Take 4 mg by mouth every 8 (eight) hours as needed for vomiting or nausea.     tamsulosin (FLOMAX) 0.4 MG CAPS capsule Take 0.4 mg by mouth daily.     traMADol (ULTRAM) 50 MG tablet Take 50 mg by mouth every 6 (six) hours as needed for moderate pain (pain score 4-6).     No current facility-administered medications for this visit.    Allergies: No Known Allergies  Past Medical History:  Diagnosis Date   Kidney stone    Dec '24   Ovarian cyst    Past Surgical History:  Procedure Laterality Date   APPENDECTOMY      CYSTOSCOPY W/ URETERAL STENT PLACEMENT Right 08/23/2023   Procedure: RETROGRADE CYSTOSCOPY WITH RIGHT URETERAL STENT PLACEMENT;  Surgeon: Malen Gauze, MD;  Location: [facility] OR;  Service: Urology;  Laterality: Right;   WISDOM TOOTH EXTRACTION     Family History  Problem Relation Age of Onset   Ovarian cysts Mother    Hypertension Mother    Kidney Stones Mother    Kidney Stones Father    Diverticulitis Father    Social History   Socioeconomic History   Marital status: Married    Spouse name: Ebony Cargo   Number of children: Not on file   Years of education: Not on file   Highest education level: Not on file  Occupational History   Occupation: Pharmacist, hospital  Tobacco Use   Smoking status: Never   Smokeless tobacco: Never  Vaping Use   Vaping status: Never Used  Substance and Sexual Activity   Alcohol use: Not Currently    Comment: rare   Drug use: No   Sexual activity: Yes    Birth control/protection: None  Other Topics Concern   Not on file  Social History Narrative   Not on file   Social Drivers of Health   Financial Resource Strain: Not on file  Food Insecurity: No Food Insecurity (08/24/2023)   Hunger Vital Sign    Worried About Running Out of Food in the Last Year: Never true    Ran Out of Food in the Last Year: Never true  Transportation Needs: No Transportation Needs (08/24/2023)  PRAPARE - Administrator, Civil Service (Medical): No    Lack of Transportation (Non-Medical): No  Physical Activity: Not on file  Stress: Not on file  Social Connections: Not on file  Intimate Partner Violence: Unknown (08/25/2023)   Humiliation, Afraid, Rape, and Kick questionnaire    Fear of Current or Ex-Partner: No    Emotionally Abused: No    Physically Abused: Not on file    Sexually Abused: No    SUBJECTIVE  Review of Systems Constitutional: Patient denies any unintentional weight loss or change in strength lntegumentary: Patient denies any rashes or  pruritus Cardiovascular: Patient denies chest pain or syncope Respiratory: Patient denies shortness of breath Gastrointestinal: Patient denies nausea, vomiting, constipation, or diarrhea Musculoskeletal: Patient denies muscle cramps or weakness Neurologic: Patient denies convulsions or seizures Allergic/Immunologic: Patient denies recent allergic reaction(s) Hematologic/Lymphatic: Patient denies bleeding tendencies Endocrine: Patient denies heat/cold intolerance  GU: As per HPI.  OBJECTIVE Vitals:   08/30/23 1154  BP: 117/74  Pulse: 82  Temp: 98.3 F (36.8 C)   There is no height or weight on file to calculate BMI.  Physical Examination Constitutional: No obvious distress; patient is non-toxic appearing  Cardiovascular: No visible lower extremity edema.  Respiratory: The patient does not have audible wheezing/stridor; respirations do not appear labored  Gastrointestinal: Abdomen non-distended Musculoskeletal: Normal ROM of UEs  Skin: No obvious rashes/open sores  Neurologic: CN 2-12 grossly intact Psychiatric: Answered questions appropriately with normal affect  Hematologic/Lymphatic/Immunologic: No obvious bruises or sites of spontaneous bleeding  Urine microscopy: 6-10 WBC/hpf, 11-30 RBC/hpf, moderate bacteria  ASSESSMENT Ureteral stone with hydronephrosis - Plan: Urinalysis, Routine w reflex microscopic  Pyelonephritis - Plan: Urinalysis, Routine w reflex microscopic  We reviewed the operative procedures and findings. She is doing well today with no acute findings. Asymptomatic for UTI. She is scheduled for right ureteroscopic stone manipulation by Dr. Ronne Binning on 09/09/2023. We discussed the possible risks and benefits of intervention including but not limited to: including pain, infection, sepsis, UTI, ureter perforation, need for stenting, post-op ureteral stricture, hematuria.   She verbalized understanding and agreement. All questions were answered.  PLAN Advised  the following: Return for surgery.   Orders Placed This Encounter  Procedures   Urinalysis, Routine w reflex microscopic    It has been explained that the patient is to follow regularly with their PCP in addition to all other providers involved in their care and to follow instructions provided by these respective offices. Patient advised to contact urology clinic if any urologic-pertaining questions, concerns, new symptoms or problems arise in the interim period.  Patient Instructions  >80% of stones are calcium oxalate. This type of stones forms when body either isn't clearing oxalate well enough, is making too much oxalate, or too little citrate. This results in oxalate binding to form crystals, which continue to aggregate and form stones.  Limiting calcium does not help, but limiting oxalate in the diet can help. Increasing citric acid intake may also help.  The following measures may help to prevent the recurrence of stones: Increase water intake to 2-2.5 liters per day May add citrus juice (lemon, lime or orange juice) to water Moderation in dairy foods Decrease in salt content 5. Low Oxalate diet: Oxylates are found in foods like Tomato, Spinach, red wine and chocolate (see additional resources below).  Internet resources for information regarding low oxalate diet: https://kidneystones.yangchunwu.com https://my.VerticalStretch.be  Foods Low in Sodium or Oxalate Foods You Can Eat  Drinks Coffee, fruit and  veggie juice (using the recommended veggies), fruit punch  Fruits Apples, apricots (fresh or canned), avocado, bananas, cherries (sweet), cranberries, grapefruit, red or green grapes, lemon and lime juice, melons, nectarines, papayas, peaches, pears, pineapples, oranges, strawberries (fresh), tangerines  Veggies Artichokes, asparagus, bamboo shoots, broccoli, brussels sprouts, cabbage,  cauliflower, chayote squash, chicory, corn, cucumbers, endive, lettuce, lima beans, mushrooms, onions, peas, peppers, potatoes, radishes, rutabagas, zucchini  Breads, Cereals, Grains Egg noodles, rye bread, cooked and dry cereals without nuts or bran, crackers with unsalted tops, white or wild rice  Meat, Meat Replacements, Fish, Recruitment consultant, fish, poultry, eggs, egg whites, egg replacements  Soup Homemade soup (using the recommended veggies and meat), low-sodium bouillon, low-sodium canned  Desserts Cookies, cakes, ice cream, pudding without chocolate or nuts, candy without chocolate or nuts  Fats and Oils Butter, margarine, cream, oil, salad dressing, mayo  Other Foods Unsalted potato chips or pretzels, herbs (like garlic, garlic powder, onion powder), lemon juice, salt-free seasoning blends, vinegar  Other Foods Low in Oxalate Foods You Can Eat  Drinks Beer, cola, wine, buttermilk, lemonade or limeade (without added vitamin C), milk  Meat, Meat Replacements, Fish, [employer] meat, ham, bacon, hot dogs, bratwurst, sausage, chicken nuggets, cheddar cheese, canned fish and shellfish  Soup Tomato soup, cheese soup  Other Foods Coconuts, lemon or lime juices, sugar or sweeteners, jellies or jams (from the recommended list)   Moderate-Oxalate Foods Foods to Limit   Drinks Fruit and veggie juices (from the list below), chocolate milk, rice milk, hot cocoa, tea   Fruits Blackberries, blueberries, black currants, cherries (sour), fruit cocktail, mangoes, orange peel, prunes, purple plums   Veggies Baked beans, carrots, celery, green beans, parsnips, summer squash, tomatoes, turnips   Breads, Cereals, Grains White bread, cornbread or cornmeal, white English muffins, saltine or soda crackers, brown rice, vanilla wafers, spaghetti and other noodles, firm tofu, bagels, oatmeal   Meat/meat replacements, fish, poultry Sardines   Desserts Chocolate cake   Fats and Oils Macadamia nuts, pistachio nuts,  English walnuts   Other Foods Jams or jellies (made with the fruits above), pepper    High-Oxalate Foods Foods to Avoid Drinks Chocolate drink mixes, soy milk, Ovaltine, instant iced tea, fruit juices of fruits listed below Fruits Apricots (dried), red currants, figs, kiwi, plums, rhubarb Veggies Beans (wax, dried), beets and beet greens, chives, collard greens, eggplant, escarole, dark greens of all kinds, leeks, okra, parsley, rutabagas, spinach, Swiss chard, tomato paste, watercress Breads, Cereals, Grains Amaranth, barley, white corn flour, fried potatoes, fruitcake, grits, soybean products, sweet potatoes, wheat germ and bran, buckwheat flour, All Bran cereal, graham crackers, pretzels, whole wheat bread Meat/meat replacements, fish, poultry  Dried beans, peanut butter, soy burgers, miso  Desserts Carob, chocolate, marmalades Fats and Oils Nuts (peanuts, almonds, pecans, cashews, hazelnuts), nut butters, sesame seeds, tahini paste Other Foods Poppy seeds   Electronically signed by:  Donnita Falls, MSN, FNP-C, CUNP 08/30/2023 12:31 PM

## 2023-08-28 LAB — CULTURE, BLOOD (ROUTINE X 2)
Culture: NO GROWTH
Culture: NO GROWTH

## 2023-08-28 NOTE — Hospital Course (Signed)
 Lorraine Barker is a 33 y.o. female with a history of kidney stones, depression, anemia.  Patient presented secondary to right flank pain and was found to have a UTI with right ureterolithiasis with resulting right hydronephrosis. Ceftriaxone started. Urology consulted and placed a right ureteral stent. Urine culture isolated proteus mirabilis and patient discharged on amoxicillin. Patient to follow-up with urology as an outpatient.

## 2023-08-30 ENCOUNTER — Encounter: Payer: Self-pay | Admitting: Urology

## 2023-08-30 ENCOUNTER — Ambulatory Visit: Payer: Self-pay | Admitting: Urology

## 2023-08-30 VITALS — BP 117/74 | HR 82 | Temp 98.3°F

## 2023-08-30 DIAGNOSIS — Z87440 Personal history of urinary (tract) infections: Secondary | ICD-10-CM | POA: Diagnosis not present

## 2023-08-30 DIAGNOSIS — N201 Calculus of ureter: Secondary | ICD-10-CM

## 2023-08-30 DIAGNOSIS — N1330 Unspecified hydronephrosis: Secondary | ICD-10-CM | POA: Diagnosis not present

## 2023-08-30 DIAGNOSIS — N12 Tubulo-interstitial nephritis, not specified as acute or chronic: Secondary | ICD-10-CM

## 2023-08-30 DIAGNOSIS — N132 Hydronephrosis with renal and ureteral calculous obstruction: Secondary | ICD-10-CM

## 2023-08-30 LAB — MICROSCOPIC EXAMINATION

## 2023-08-30 LAB — URINALYSIS, ROUTINE W REFLEX MICROSCOPIC
Bilirubin, UA: NEGATIVE
Glucose, UA: NEGATIVE
Ketones, UA: NEGATIVE
Nitrite, UA: NEGATIVE
Specific Gravity, UA: >1.03 — ABNORMAL HIGH (ref 1.005–1.030)
Urobilinogen, Ur: 0.2 mg/dL (ref 0.2–1.0)
pH, UA: 6 (ref 5.0–7.5)

## 2023-08-30 NOTE — Patient Instructions (Signed)

## 2023-09-01 ENCOUNTER — Other Ambulatory Visit: Payer: Self-pay | Admitting: Urology

## 2023-09-01 DIAGNOSIS — R339 Retention of urine, unspecified: Secondary | ICD-10-CM | POA: Diagnosis not present

## 2023-09-01 DIAGNOSIS — F411 Generalized anxiety disorder: Secondary | ICD-10-CM | POA: Diagnosis not present

## 2023-09-02 ENCOUNTER — Ambulatory Visit: Payer: Self-pay | Admitting: Urology

## 2023-09-06 ENCOUNTER — Encounter (HOSPITAL_COMMUNITY): Payer: Self-pay

## 2023-09-06 NOTE — Patient Instructions (Addendum)
 Lorraine Barker  09/06/2023     @PREFPERIOPPHARMACY @   Your procedure is scheduled on 09/09/2023.   Report to [facility] at 11:30 A.M.   Call this number if you have problems the morning of surgery:  [phone]  If you experience any cold or flu symptoms such as cough, fever, chills, shortness of breath, etc. between now and your scheduled surgery, please notify us at the above number.   Remember:  Do not eat after midnight.  You may drink clear liquids until 9:30AM .  Clear liquids allowed are:                    Water, Carbonated beverages (diabetics please choose diet or no sugar options), Clear Tea (No creamer, milk, or cream, including half & half and powdered creamer), Black Coffee Only (No creamer, milk or cream, including half & half and powdered creamer), and Clear Sports drink (No red color; diabetics please choose diet or no sugar options)    Take these medicines the morning of surgery with A SIP OF WATER :  Lexapro Zofran (if needed) Flomax and Tramadol      Do not wear jewelry, make-up or nail polish, including gel polish,  artificial nails, or any other type of covering on natural nails (fingers and  toes).  Do not wear lotions, powders, or perfumes, or deodorant.  Do not shave 48 hours prior to surgery.  Men may shave face and neck.  Do not bring valuables to the hospital.  [facility] is not responsible for any belongings or valuables.  Contacts, dentures or bridgework may not be worn into surgery.  Leave your suitcase in the car.  After surgery it may be brought to your room.  For patients admitted to the hospital, discharge time will be determined by your treatment team.  Patients discharged the day of surgery will not be allowed to drive home.   Name and phone number of your driver:   Family Special instructions:  N/A  Please read over the following fact sheets that you were given.  Care and Recovery After Surgery  Cystoscopy A cystoscopy may be done to  find or treat a condition in your lower urinary tract. Your lower urinary tract includes your bladder and urethra. The urethra is the part of your body that drains pee (urine) from your bladder. You may need this procedure if: You have: Urinary tract infections (UTIs) that keep coming back. Blood in your pee. Pain when you pee. A blockage in your urethra, such as a urinary stone. You can't control when you pee, or you have to pee a lot. There are cells that aren't normal in your pee sample. A problem is found in your bladder during a test. You need a biopsy. This is when a small piece of tissue is removed for testing. Tell a health care provider about: Any allergies you have. All medicines you take. These include vitamins, herbs, eye drops, and creams. Any problems you or family members have had with anesthesia. Any bleeding problems you have. Any surgeries you've had. Any medical conditions you have. Whether you're pregnant or may be pregnant. What are the risks? Your health care provider will talk with you about risks. These may include: Infection. Bleeding. Allergic reactions to medicines. Damage to your urethra or bladder. What happens before the procedure? Medicines Ask about changing or stopping: Any medicines you take. Any vitamins, herbs, or supplements you take. Do not take aspirin or ibuprofen  unless you're told to. Tests You may have an exam or tests. These may include: Pee tests to check for signs of infection. X-rays of: Your bladder. Your urethra. Your kidneys. A CT scan of your belly or hips. General instructions Eat and drink only as you've been told. For your safety, you may: Need to wash your skin with a soap that kills germs. Get antibiotics. Have hair removed at the procedure site. If you'll be going home right after the procedure, plan to have a responsible adult: Take you home from the hospital or clinic. You won't be allowed to drive. Stay with  you for the time you're told. What happens during the procedure?  You may be given: A sedative to help you relax. Anesthesia to keep you from feeling pain. The opening of your urethra will be cleaned. A thin tube called a cystoscope will be put into your urethra. The tube has a light and camera on the end of it. The tube will be passed into your bladder. A germ-free (sterile) fluid will flow through the tube. The fluid will stretch your bladder. This helps your provider see the walls of your bladder more clearly. A biopsy may be taken. Stones may be removed. The tube will be taken out. Your bladder will be emptied. The procedure may vary among providers and hospitals. What can I expect after the procedure? It's common to have: Some soreness or pain in your belly and urethra. Mild pain or burning when you pee. The pain should stop a few minutes after you pee. This may last for up to a week. A small amount of blood in your pee for a few days. A feeling like you need to pee often. But when you do, you may only pee a little. Follow these instructions at home: Medicines Take your medicines only as told. If you were given antibiotics, take them as told. Do not stop taking them even if you start to feel better. General instructions If you were given a sedative, do not drive or use machines until you're told it's safe. A sedative can make you sleepy. Eat and drink as told. If a biopsy was taken, ask when your test results will be ready and how to get them. You may need to call or meet with your provider to get your results. Ask what things are safe for you to do at home. Ask when you can go back to work or school. Contact a health care provider if: Your pain gets worse. Your pain doesn't get better with medicine. You have trouble peeing. You have more blood in your pee. You have a fever or chills. Get help right away if: You have blood clots in your pee. You can't pee. This information  is not intended to replace advice given to you by your health care provider. Make sure you discuss any questions you have with your health care provider. Document Revised: 10/20/2022 Document Reviewed: 10/20/2022 Elsevier Patient Education  2024 [employer]  Ureteroscopy  Ureteroscopy is a procedure to check for and treat problems inside part of the urinary tract. In this procedure, a long rigid or flexible tube with a lens and light at the end (ureteroscope) is used to look at the inside of the kidneys and the ureters. The ureters are the tubes that carry urine from the kidneys to the bladder. The ureteroscope is inserted into one or both of the ureters. You may need this procedure if you have frequent urinary tract infections (  UTIs), blood in your urine, or a stone in one or both of your ureters. A ureteroscopy can be done: To find the cause of urine blockage in a ureter and to evaluate other abnormalities inside the ureters or kidneys. To remove stones. To remove or treat growths of tissue (polyps), abnormal tissue, and some types of tumors. To remove a tissue sample and check it for disease under a microscope (biopsy). Tell a health care provider about: Any allergies you have. All medicines you are taking, including vitamins, herbs, eye drops, creams, and over-the-counter medicines. Any problems you or family members have had with anesthetic medicines. Any bleeding problems you have. Any surgeries you have had. Any medical conditions you have. Whether you are pregnant or may be pregnant. What are the risks? Your health care provider will talk with you about risks. These may include: Abdominal pain or a burning feeling or pain while urinating. Abnormal bleeding. A UTI. Allergic reactions to medicines. Scarring that narrows the ureter (stricture) or swelling. Creating a hole (perforation) in the ureter. Damage to other structures or organs, such as the part of your body that drains  urine from your bladder (urethra), your bladder, or your uterus. What happens before the procedure? When to stop eating and drinking  8 hours before your procedure Stop eating most foods. Do not eat meat, fried foods, or fatty foods. Eat only light foods, such as toast or crackers. All liquids are okay except energy drinks and alcohol. 6 hours before your procedure Stop eating. Drink only clear liquids, such as water, clear fruit juice, black coffee, plain tea, and sports drinks. Do not drink energy drinks or alcohol. 2 hours before your procedure Stop drinking all liquids. You may be allowed to take medicines with small sips of water. Medicines Ask your health care provider about: Changing or stopping your regular medicines. These include any diabetes medicines or blood thinners you take. Taking medicines such as aspirin and ibuprofen. These medicines can thin your blood. Do not take these medicines unless your health care provider tells you to. Taking over-the-counter medicines, vitamins, herbs, and supplements. General instructions Do not use any products that contain nicotine or tobacco for at least 4 weeks before the procedure. These products include cigarettes, chewing tobacco, and vaping devices, such as e-cigarettes. If you need help quitting, ask your health care provider. If you will be going home right after the procedure, plan to have a responsible adult: Take you home from the hospital or clinic. You will not be allowed to drive. Care for you for the time you are told. Ask your health care provider what steps will be taken to help prevent infection. These may include: Washing skin with a soap that kills germs. Receiving antibiotic medicine. Tests You may have an exam or testing. You may have a urine sample taken to check for infection. What happens during the procedure? An IV will be inserted into one of your veins. You may be given: A sedative. This helps you  relax. Anesthesia. This will: Numb certain areas of your body. Make you fall asleep for surgery. Your urethra will be cleaned with a germ-killing solution. The ureteroscope will be passed through your urethra into your bladder. A salt-water solution will be sent through the ureteroscope to fill your bladder. This will help the health care provider see the openings of your ureters more clearly. The ureteroscope will be passed into your ureter. If a growth is found, a biopsy may be done. If a  stone is found, it may be removed through the ureteroscope, or the stone may be broken up using a laser, shock waves, or electrical energy. In some cases, if the ureter is too small, a tube may be inserted that keeps the ureter open (ureteral stent). The stent may be left in place for 1 or 2 weeks, and then the ureteroscopy procedure will be done again. The scope will be removed, and your bladder will be emptied. The procedure may vary among health care providers and hospitals. What happens after the procedure? Your blood pressure, heart rate, breathing rate, and blood oxygen level will be monitored until you leave the hospital or clinic. It is up to you to get the results of your procedure. Ask your health care provider, or the department that is doing the procedure, when your results will be ready. Summary Ureteroscopy is a procedure used to look at the inside of the kidneys and the ureters. You may need this procedure if you have frequent urinary tract infections (UTIs), blood in your urine, or a stone in one or both of your ureters. Follow instructions from your health care provider about eating and drinking. In some cases, if the ureter is too small, a tube may be inserted that keeps the ureter open (ureteral stent). The stent may be left in place for 1 or 2 weeks to keep the ureter open, and then the ureteroscopy procedure will be done again. This information is not intended to replace advice given to  you by your health care provider. Make sure you discuss any questions you have with your health care provider. Document Revised: 05/18/2022 Document Reviewed: 05/18/2022 Elsevier Patient Education  2024 [employer].  How to Use Chlorhexidine at Home in the Shower Chlorhexidine gluconate (CHG) is a germ-killing (antiseptic) wash that's used to clean the skin. It can get rid of the germs that normally live on the skin and can keep them away for about 24 hours. If you're having surgery, you may be told to shower with CHG at home the night before surgery. This can help lower your risk for infection. To use CHG wash in the shower, follow the steps below. Supplies needed: CHG body wash. Clean washcloth. Clean towel. How to use CHG in the shower Follow these steps unless you're told to use CHG in a different way: Start the shower. Use your normal soap and shampoo to wash your face and hair. Turn off the shower or move out of the shower stream. Pour CHG onto a clean washcloth. Do not use any type of brush or rough sponge. Start at your neck, washing your body down to your toes. Make sure you: Wash the part of your body where the surgery will be done for at least 1 minute. Do not scrub. Do not use CHG on your head or face unless your health care provider tells you to. If it gets into your ears or eyes, rinse them well with water. Do not wash your genitals with CHG. Wash your back and under your arms. Make sure to wash skin folds. Let the CHG sit on your skin for 1-2 minutes or as long as told. Rinse your entire body in the shower, including all body creases and folds. Turn off the shower. Dry off with a clean towel. Do not put anything on your skin afterward, such as powder, lotion, or perfume. Put on clean clothes or pajamas. If it's the night before surgery, sleep in clean sheets. General tips Use CHG  only as told, and follow the instructions on the label. Use the full amount of CHG as  told. This is often one bottle. Do not smoke and stay away from flames after using CHG. Your skin may feel sticky after using CHG. This is normal. The sticky feeling will go away as the CHG dries. Do not use CHG: If you have a chlorhexidine allergy or have reacted to chlorhexidine in the past. On open wounds or areas of skin that have broken skin, cuts, or scrapes. On babies younger than 78 months of age. Contact a health care provider if: You have questions about using CHG. Your skin gets irritated or itchy. You have a rash after using CHG. You swallow any CHG. Call your local poison control center [phone] in the [place] Your eyes itch badly, or they become very red or swollen. Your hearing changes. You have trouble seeing. If you can't reach your provider, go to an urgent care or emergency room. Do not drive yourself. Get help right away if: You have swelling or tingling in your mouth or throat. You make high-pitched whistling sounds when you breathe, most often when you breathe out (wheeze). You have trouble breathing. These symptoms may be an emergency. Call 911 right away. Do not wait to see if the symptoms will go away. Do not drive yourself to the hospital. This information is not intended to replace advice given to you by your health care provider. Make sure you discuss any questions you have with your health care provider. Document Revised: 12/29/2022 Document Reviewed: 12/25/2021 Elsevier Patient Education  2024 [employer] Inc[employer]General Anesthesia, Adult General anesthesia is the use of medicine to make you fall asleep (unconscious) for a medical procedure. General anesthesia must be used for certain procedures. It is often recommended for surgery or procedures that: Last a long time. Require you to be still or in an unusual position. Are major and can cause blood loss. Affect your breathing. The medicines used for general anesthesia are called general anesthetics. During  general anesthesia, these medicines are given along with medicines that: Prevent pain. Control your blood pressure. Relax your muscles. Prevent nausea and vomiting after the procedure. Tell a health care provider about: Any allergies you have. All medicines you are taking, including vitamins, herbs, eye drops, creams, and over-the-counter medicines. Your history of any: Medical conditions you have, including: High blood pressure. Bleeding problems. Diabetes. Heart or lung conditions, such as: Heart failure. Sleep apnea. Asthma. Chronic obstructive pulmonary disease (COPD). Current or recent illnesses, such as: Upper respiratory, chest, or ear infections. Cough or fever. Tobacco or drug use, including marijuana or alcohol use. Depression or anxiety. Surgeries and types of anesthetics you have had. Problems you or family members have had with anesthetic medicines. Whether you are pregnant or may be pregnant. Whether you have any chipped or loose teeth, dentures, caps, bridgework, or issues with your mouth, swallowing, or choking. What are the risks? Your health care provider will talk with you about risks. These may include: Allergic reaction to the medicines. Lung and heart problems. Inhaling food or liquid from the stomach into the lungs (aspiration). Nerve injury. Injury to the lips, mouth, teeth, or gums. Stroke. Waking up during your procedure and being unable to move. This is rare. These problems are more likely to develop if you are having a major surgery or if you have an advanced or serious medical condition. You can prevent some of these complications by answering all of your health care provider's  questions thoroughly and by following all instructions before your procedure. General anesthesia can cause side effects, including: Nausea or vomiting. A sore throat or hoarseness from the breathing tube. Wheezing or coughing. Shaking chills or feeling cold. Body  aches. Sleepiness. Confusion, agitation (delirium), or anxiety. What happens before the procedure? When to stop eating and drinking Follow instructions from your health care provider about what you may eat and drink before your procedure. If you do not follow your health care provider's instructions, your procedure may be delayed or canceled. Medicines Ask your health care provider about: Changing or stopping your regular medicines. These include any diabetes medicines or blood thinners you take. Taking medicines such as aspirin and ibuprofen. These medicines can thin your blood. Do not take them unless your health care provider tells you to. Taking over-the-counter medicines, vitamins, herbs, and supplements. General instructions Do not use any products that contain nicotine or tobacco for at least 4 weeks before the procedure. These products include cigarettes, chewing tobacco, and vaping devices, such as e-cigarettes. If you need help quitting, ask your health care provider. If you brush your teeth on the morning of the procedure, make sure to spit out all of the water and toothpaste. If told by your health care provider, bring your sleep apnea device with you to surgery (if applicable). If you will be going home right after the procedure, plan to have a responsible adult: Take you home from the hospital or clinic. You will not be allowed to drive. Care for you for the time you are told. What happens during the procedure?  An IV will be inserted into one of your veins. You will be given one or more of the following through a face mask or IV: A sedative. This helps you relax. Anesthesia. This will: Numb certain areas of your body. Make you fall asleep for surgery. After you are unconscious, a breathing tube may be inserted down your throat to help you breathe. This will be removed before you wake up. An anesthesia provider, such as an anesthesiologist, will stay with you throughout your  procedure. The anesthesia provider will: Keep you comfortable and safe by continuing to give you medicines and adjusting the amount of medicine that you get. Monitor your blood pressure, heart rate, and oxygen levels to make sure that the anesthetics do not cause any problems. The procedure may vary among health care providers and hospitals. What happens after the procedure? Your blood pressure, temperature, heart rate, breathing rate, and blood oxygen level will be monitored until you leave the hospital or clinic. You will wake up in a recovery area. You may wake up slowly. You may be given medicine to help you with pain, nausea, or any other side effects from the anesthesia. Summary General anesthesia is the use of medicine to make you fall asleep (unconscious) for a medical procedure. Follow your health care provider's instructions about when to stop eating, drinking, or taking certain medicines before your procedure. Plan to have a responsible adult take you home from the hospital or clinic. This information is not intended to replace advice given to you by your health care provider. Make sure you discuss any questions you have with your health care provider. Document Revised: 09/11/2021 Document Reviewed: 09/11/2021 Elsevier Patient Education  2024 [employer].

## 2023-09-07 ENCOUNTER — Encounter (HOSPITAL_COMMUNITY): Payer: Self-pay

## 2023-09-07 ENCOUNTER — Other Ambulatory Visit (HOSPITAL_COMMUNITY)

## 2023-09-07 ENCOUNTER — Other Ambulatory Visit: Payer: Self-pay

## 2023-09-07 ENCOUNTER — Encounter (HOSPITAL_COMMUNITY)
Admission: RE | Admit: 2023-09-07 | Discharge: 2023-09-07 | Disposition: A | Discharge status: Home / Self Care | Source: Ambulatory Visit | Attending: Urology | Admitting: Urology

## 2023-09-07 VITALS — BP 117/74 | HR 82 | Temp 98.3°F | Resp 18 | Ht 63.5 in | Wt 134.7 lb

## 2023-09-07 DIAGNOSIS — Z87442 Personal history of urinary calculi: Secondary | ICD-10-CM | POA: Diagnosis not present

## 2023-09-07 DIAGNOSIS — N201 Calculus of ureter: Secondary | ICD-10-CM | POA: Diagnosis not present

## 2023-09-07 DIAGNOSIS — D649 Anemia, unspecified: Secondary | ICD-10-CM | POA: Insufficient documentation

## 2023-09-07 DIAGNOSIS — E876 Hypokalemia: Secondary | ICD-10-CM

## 2023-09-07 DIAGNOSIS — Z01812 Encounter for preprocedural laboratory examination: Secondary | ICD-10-CM | POA: Insufficient documentation

## 2023-09-07 HISTORY — DX: Depression, unspecified: F32.A

## 2023-09-07 HISTORY — DX: Personal history of urinary calculi: Z87.442

## 2023-09-07 LAB — BASIC METABOLIC PANEL
Anion gap: 11 (ref 5–15)
BUN: 18 mg/dL (ref 6–20)
CO2: 25 mmol/L (ref 22–32)
Calcium: 8.9 mg/dL (ref 8.9–10.3)
Chloride: 105 mmol/L (ref 98–111)
Creatinine, Ser: 0.78 mg/dL (ref 0.44–1.00)
GFR, Estimated: >60 mL/min (ref >60)
Glucose, Bld: 90 mg/dL (ref 70–99)
Potassium: 4 mmol/L (ref 3.5–5.1)
Sodium: 141 mmol/L (ref 135–145)

## 2023-09-07 LAB — CBC WITH DIFFERENTIAL/PLATELET
Abs Immature Granulocytes: 0.01 10*3/uL (ref 0.00–0.07)
Basophils Absolute: 0 10*3/uL (ref 0.0–0.1)
Basophils Relative: 1 %
Eosinophils Absolute: 0.2 10*3/uL (ref 0.0–0.5)
Eosinophils Relative: 4 %
HCT: 32.8 % — ABNORMAL LOW (ref 36.0–46.0)
Hemoglobin: 9.5 g/dL — ABNORMAL LOW (ref 12.0–15.0)
Immature Granulocytes: 0 %
Lymphocytes Relative: 30 %
Lymphs Abs: 1.7 10*3/uL (ref 0.7–4.0)
MCH: 24.7 pg — ABNORMAL LOW (ref 26.0–34.0)
MCHC: 29 g/dL — ABNORMAL LOW (ref 30.0–36.0)
MCV: 85.4 fL (ref 80.0–100.0)
Monocytes Absolute: 0.5 10*3/uL (ref 0.1–1.0)
Monocytes Relative: 10 %
Neutro Abs: 3.1 10*3/uL (ref 1.7–7.7)
Neutrophils Relative %: 55 %
Platelets: 297 10*3/uL (ref 150–400)
RBC: 3.84 MIL/uL — ABNORMAL LOW (ref 3.87–5.11)
RDW: 13.9 % (ref 11.5–15.5)
WBC: 5.5 10*3/uL (ref 4.0–10.5)
nRBC: 0 % (ref 0.0–0.2)

## 2023-09-09 ENCOUNTER — Encounter (HOSPITAL_COMMUNITY): Payer: Self-pay | Admitting: Urology

## 2023-09-09 ENCOUNTER — Ambulatory Visit (HOSPITAL_COMMUNITY): Admitting: Anesthesiology

## 2023-09-09 ENCOUNTER — Encounter (HOSPITAL_COMMUNITY)
Admission: RE | Disposition: A | Discharge status: Home / Self Care | Payer: Self-pay | Source: Home / Self Care | Attending: Urology

## 2023-09-09 ENCOUNTER — Ambulatory Visit (HOSPITAL_COMMUNITY)

## 2023-09-09 ENCOUNTER — Ambulatory Visit (HOSPITAL_COMMUNITY)
Admission: RE | Admit: 2023-09-09 | Discharge: 2023-09-09 | Disposition: A | Discharge status: Home / Self Care | Attending: Urology | Admitting: Urology

## 2023-09-09 DIAGNOSIS — N201 Calculus of ureter: Secondary | ICD-10-CM | POA: Diagnosis not present

## 2023-09-09 DIAGNOSIS — Z87442 Personal history of urinary calculi: Secondary | ICD-10-CM | POA: Insufficient documentation

## 2023-09-09 HISTORY — PX: CYSTOSCOPY/RETROGRADE/URETEROSCOPY/STONE EXTRACTION WITH BASKET: SHX5317

## 2023-09-09 HISTORY — PX: HOLMIUM LASER APPLICATION: SHX5852

## 2023-09-09 HISTORY — PX: CYSTOSCOPY WITH RETROGRADE PYELOGRAM, URETEROSCOPY AND STENT PLACEMENT: SHX5789

## 2023-09-09 LAB — POCT PREGNANCY, URINE: Preg Test, Ur: NEGATIVE

## 2023-09-09 SURGERY — CYSTOURETEROSCOPY, WITH RETROGRADE PYELOGRAM AND STENT INSERTION
Anesthesia: General | Site: Ureter | Laterality: Right

## 2023-09-09 MED ORDER — MIDAZOLAM HCL 5 MG/5ML IJ SOLN
INTRAMUSCULAR | Status: DC | PRN
  Administered 2023-09-09: 2 mg via INTRAVENOUS

## 2023-09-09 MED ORDER — OXYCODONE HCL 5 MG/5ML PO SOLN: 5.0000 mg | Freq: Once | ORAL | Status: DC | PRN

## 2023-09-09 MED ORDER — LIDOCAINE HCL (PF) 2 % IJ SOLN
INTRAMUSCULAR | Status: AC
  Filled 2023-09-09: qty 5

## 2023-09-09 MED ORDER — MIDAZOLAM HCL 2 MG/2ML IJ SOLN
INTRAMUSCULAR | Status: AC
  Filled 2023-09-09: qty 2

## 2023-09-09 MED ORDER — OXYCODONE HCL 5 MG PO TABS: 5.0000 mg | ORAL_TABLET | Freq: Once | ORAL | Status: DC | PRN

## 2023-09-09 MED ORDER — LACTATED RINGERS IV SOLN: INTRAVENOUS | Status: DC

## 2023-09-09 MED ORDER — SODIUM CHLORIDE 0.9 % IR SOLN
Status: DC | PRN
  Administered 2023-09-09: 3000 mL

## 2023-09-09 MED ORDER — KETAMINE HCL 50 MG/5ML IJ SOSY
PREFILLED_SYRINGE | INTRAMUSCULAR | Status: AC
  Filled 2023-09-09: qty 5

## 2023-09-09 MED ORDER — HYDROCODONE-ACETAMINOPHEN 5-325 MG PO TABS: 1.0000 | ORAL_TABLET | Freq: Four times a day (QID) | ORAL | 0 refills | Status: AC | PRN

## 2023-09-09 MED ORDER — ONDANSETRON HCL 4 MG/2ML IJ SOLN
INTRAMUSCULAR | Status: AC
  Filled 2023-09-09: qty 2

## 2023-09-09 MED ORDER — DIATRIZOATE MEGLUMINE 30 % UR SOLN
URETHRAL | Status: AC
  Filled 2023-09-09: qty 100

## 2023-09-09 MED ORDER — FENTANYL CITRATE PF 50 MCG/ML IJ SOSY: 25.0000 ug | PREFILLED_SYRINGE | INTRAMUSCULAR | Status: DC | PRN

## 2023-09-09 MED ORDER — PROPOFOL 10 MG/ML IV BOLUS
INTRAVENOUS | Status: DC | PRN
  Administered 2023-09-09: 130 mg via INTRAVENOUS

## 2023-09-09 MED ORDER — DIATRIZOATE MEGLUMINE 30 % UR SOLN
URETHRAL | Status: DC | PRN
  Administered 2023-09-09: 7 mL via URETHRAL

## 2023-09-09 MED ORDER — FENTANYL CITRATE (PF) 100 MCG/2ML IJ SOLN
INTRAMUSCULAR | Status: AC
  Filled 2023-09-09: qty 2

## 2023-09-09 MED ORDER — ONDANSETRON HCL 4 MG/2ML IJ SOLN: 4.0000 mg | Freq: Once | INTRAMUSCULAR | Status: DC | PRN

## 2023-09-09 MED ORDER — PROPOFOL 10 MG/ML IV BOLUS
INTRAVENOUS | Status: AC
  Filled 2023-09-09: qty 20

## 2023-09-09 MED ORDER — CEFAZOLIN SODIUM-DEXTROSE 2-4 GM/100ML-% IV SOLN
2.0000 g | INTRAVENOUS | Status: AC
  Administered 2023-09-09: 2 g via INTRAVENOUS
  Filled 2023-09-09: qty 100

## 2023-09-09 MED ORDER — ORAL CARE MOUTH RINSE: 15.0000 mL | Freq: Once | OROMUCOSAL | Status: AC

## 2023-09-09 MED ORDER — GLYCOPYRROLATE PF 0.2 MG/ML IJ SOSY
PREFILLED_SYRINGE | INTRAMUSCULAR | Status: DC | PRN
  Administered 2023-09-09: .2 mg via INTRAVENOUS

## 2023-09-09 MED ORDER — FENTANYL CITRATE (PF) 250 MCG/5ML IJ SOLN
INTRAMUSCULAR | Status: DC | PRN
  Administered 2023-09-09 (×2): 50 ug via INTRAVENOUS

## 2023-09-09 MED ORDER — LIDOCAINE HCL (PF) 2 % IJ SOLN
INTRAMUSCULAR | Status: DC | PRN
  Administered 2023-09-09: 100 mg

## 2023-09-09 MED ORDER — KETAMINE HCL 50 MG/5ML IJ SOSY
PREFILLED_SYRINGE | INTRAMUSCULAR | Status: DC | PRN
  Administered 2023-09-09: 50 mg via INTRAVENOUS

## 2023-09-09 MED ORDER — WATER FOR IRRIGATION, STERILE IR SOLN
Status: DC | PRN
  Administered 2023-09-09: 1000 mL

## 2023-09-09 MED ORDER — ONDANSETRON HCL 4 MG/2ML IJ SOLN
INTRAMUSCULAR | Status: DC | PRN
  Administered 2023-09-09: 4 mg via INTRAVENOUS

## 2023-09-09 MED ORDER — GLYCOPYRROLATE PF 0.2 MG/ML IJ SOSY
PREFILLED_SYRINGE | INTRAMUSCULAR | Status: AC
  Filled 2023-09-09: qty 1

## 2023-09-09 MED ORDER — CHLORHEXIDINE GLUCONATE 0.12 % MT SOLN
15.0000 mL | Freq: Once | OROMUCOSAL | Status: AC
  Administered 2023-09-09: 15 mL via OROMUCOSAL
  Filled 2023-09-09: qty 15

## 2023-09-09 MED ORDER — DEXAMETHASONE SODIUM PHOSPHATE 10 MG/ML IJ SOLN
INTRAMUSCULAR | Status: AC
  Filled 2023-09-09: qty 1

## 2023-09-09 MED ORDER — DEXAMETHASONE SODIUM PHOSPHATE 10 MG/ML IJ SOLN
INTRAMUSCULAR | Status: DC | PRN
  Administered 2023-09-09: 10 mg via INTRAVENOUS

## 2023-09-09 MED ORDER — EPHEDRINE SULFATE-NACL 50-0.9 MG/10ML-% IV SOSY
PREFILLED_SYRINGE | INTRAVENOUS | Status: DC | PRN
  Administered 2023-09-09: 5 mg via INTRAVENOUS

## 2023-09-09 SURGICAL SUPPLY — 24 items
BAG DRAIN URO TABLE W/ADPT NS (BAG) ×2 IMPLANT
BAG HAMPER (MISCELLANEOUS) ×2 IMPLANT
CATH INTERMIT  6FR 70CM (CATHETERS) ×2 IMPLANT
CLOTH BEACON ORANGE TIMEOUT ST (SAFETY) ×2 IMPLANT
DECANTER SPIKE VIAL GLASS SM (MISCELLANEOUS) ×2 IMPLANT
EXTRACTOR STONE NITINOL NGAGE (UROLOGICAL SUPPLIES) IMPLANT
GLOVE BIO SURGEON STRL SZ8 (GLOVE) ×2 IMPLANT
GLOVE BIOGEL PI IND STRL 7.0 (GLOVE) ×4 IMPLANT
GOWN STRL REUS W/TWL LRG LVL3 (GOWN DISPOSABLE) ×2 IMPLANT
GOWN STRL REUS W/TWL XL LVL3 (GOWN DISPOSABLE) ×2 IMPLANT
GUIDEWIRE STR DUAL SENSOR (WIRE) ×2 IMPLANT
GUIDEWIRE STR ZIPWIRE 035X150 (MISCELLANEOUS) ×2 IMPLANT
KIT TURNOVER CYSTO (KITS) ×2 IMPLANT
MANIFOLD NEPTUNE II (INSTRUMENTS) ×2 IMPLANT
PACK CYSTO (CUSTOM PROCEDURE TRAY) ×2 IMPLANT
PAD ARMBOARD POSITIONER FOAM (MISCELLANEOUS) ×2 IMPLANT
POSITIONER HEAD 8X9X4 ADT (SOFTGOODS) ×2 IMPLANT
SOL .9 NS 3000ML IRR UROMATIC (IV SOLUTION) ×4 IMPLANT
STENT URET 6FRX24 CONTOUR (STENTS) IMPLANT
SYR 10ML LL (SYRINGE) ×2 IMPLANT
SYR CONTROL 10ML LL (SYRINGE) ×2 IMPLANT
TOWEL OR 17X26 4PK STRL BLUE (TOWEL DISPOSABLE) ×2 IMPLANT
TRACTIP FLEXIVA PULS ID 200XHI (Laser) IMPLANT
WATER STERILE IRR 500ML POUR (IV SOLUTION) ×2 IMPLANT

## 2023-09-09 NOTE — Op Note (Signed)
 Preoperative diagnosis: Right ureteral stone  Postoperative diagnosis: Same  Procedure: 1 cystoscopy 2. right retrograde pyelography 3.  Intraoperative fluoroscopy, under one hour, with interpretation 4.  Right ureteroscopic stone manipulation with laser lithotripsy 5.  Right 6 x 24 JJ stent exchange  Attending: Cleda Mccreedy  Anesthesia: General  Estimated blood loss: None  Drains: Right 6 x 24 JJ ureteral stent with tether  Specimens: stone for analysis  Antibiotics: ancef  Findings: right distal ureteral calculus, no right hydronephrosis.  Indications: Patient is a 33 year old female with a history of a right ureteral stone who underwent stent placement 3 weeks ago for sepsis.  After discussing treatment options, she decided proceed with right ureteroscopic stone manipulation.  Procedure in detail: The patient was brought to the operating room and a brief timeout was done to ensure correct patient, correct procedure, correct site.  General anesthesia was administered patient was placed in dorsal lithotomy position.  Her genitalia was then prepped and draped in usual sterile fashion.  A rigid 22 French cystoscope was passed in the urethra and the bladder.  Bladder was inspected free masses or lesions.  the right ureteral orifices were in the normal orthotopic locations.  a 6 french ureteral catheter was then instilled into the right ureter orifice.  a gentle retrograde was obtained and findings noted above. Using a grasper the right ureteral stent was brought to the urethral meatus.  we then placed a zip wire through the ureteral stent and advanced up to the renal pelvis. We removed the stent. we then removed the cystoscope and cannulated the right ureteral orifice with a semirigid ureteroscope.  we then encountered the stone in the distal ureter and using a 242 nm laser fiber and fragmented the stone into smaller pieces.  the pieces were then removed with a Ngage basket.  once all  stone fragments were removed we then placed a 6 x 24 double-j ureteral stent over the original zip wire. We then removed the wire and good coil was noted in the the renal pelvis under fluoroscopy and the bladder under direct vision.   the stone fragments were then removed from the bladder and sent for analysis.   the bladder was then drained and this concluded the procedure which was well tolerated by patient.  Complications: None  Condition: Stable, extubated, transferred to PACU  Plan: Patient is to be discharged home as to follow-up in one week. She is to remove her stent in 72 hours by pulling the tether

## 2023-09-09 NOTE — Transfer of Care (Signed)
 Immediate Anesthesia Transfer of Care Note  Patient: Lorraine Barker  Procedure(s) Performed: CYSTOSCOPY WITH RETROGRADE PYELOGRAM, URETEROSCOPY AND STENT PLACEMENT- stent exchange (Right: Ureter) HOLMIUM LASER APPLICATION (Right) CYSTOSCOPY, WITH CALCULUS REMOVAL USING BASKET (Right: Ureter)  Patient Location: PACU  Anesthesia Type:General  Level of Consciousness: awake, alert , and oriented  Airway & Oxygen Therapy: Patient Spontanous Breathing and Patient connected to face mask oxygen  Post-op Assessment: Report given to RN and Post -op Vital signs reviewed and stable  Post vital signs: Reviewed and stable  Last Vitals:  Vitals Value Taken Time  BP 139/90 09/09/23 1030  Temp 36.3 C 09/09/23 1030  Pulse 78 09/09/23 1034  Resp 13 09/09/23 1034  SpO2 98 % 09/09/23 1034  Vitals shown include unfiled device data.  Last Pain:  Vitals:   09/09/23 0813  PainSc: 0-No pain         Complications: No notable events documented.

## 2023-09-09 NOTE — Anesthesia Procedure Notes (Signed)
 Procedure Name: LMA Insertion Date/Time: 09/09/2023 10:05 AM  Performed by: Izola Price., CRNAPre-anesthesia Checklist: Patient identified, Emergency Drugs available, Suction available and Patient being monitored Patient Re-evaluated:Patient Re-evaluated prior to induction Oxygen Delivery Method: Circle System Utilized Preoxygenation: Pre-oxygenation with 100% oxygen Induction Type: IV induction LMA: LMA with gastric port inserted LMA Size: 4.0 Number of attempts: 1 Airway Equipment and Method: Bite block Placement Confirmation: positive ETCO2 Tube secured with: Tape Dental Injury: Teeth and Oropharynx as per pre-operative assessment

## 2023-09-09 NOTE — Discharge Instructions (Addendum)
 Please remove your stent in 72 hours (3 days- Sunday) by pulling on the tether.

## 2023-09-09 NOTE — Interval H&P Note (Signed)
 History and Physical Interval Note:  09/09/2023 8:32 AM  Lorraine Barker  has presented today for surgery, with the diagnosis of Right Ureteral Stone.  The various methods of treatment have been discussed with the patient and family. After consideration of risks, benefits and other options for treatment, the patient has consented to  Procedure(s) with comments: CYSTOSCOPY WITH RETROGRADE PYELOGRAM, URETEROSCOPY AND STENT PLACEMENT- stent exchange (Right) - pt knows to arrive at 7:30 HOLMIUM LASER APPLICATION (Right) as a surgical intervention.  The patient's history has been reviewed, patient examined, no change in status, stable for surgery.  I have reviewed the patient's chart and labs.  Questions were answered to the patient's satisfaction.     Wilkie Aye

## 2023-09-09 NOTE — Anesthesia Preprocedure Evaluation (Addendum)
 Anesthesia Evaluation  Patient identified by MRN, date of birth, ID band Patient awake    Reviewed: Allergy & Precautions, NPO status , Patient's Chart, lab work & pertinent test results  Airway Mallampati: I  TM Distance: >3 FB Neck ROM: Full    Dental  (+) Teeth Intact, Dental Advisory Given   Pulmonary neg pulmonary ROS   Pulmonary exam normal breath sounds clear to auscultation       Cardiovascular negative cardio ROS  Rhythm:Regular Rate:Tachycardia     Neuro/Psych negative neurological ROS     GI/Hepatic negative GI ROS, Neg liver ROS,,,  Endo/Other  negative endocrine ROS    Renal/GU Renal disease (Right Ureteral Stone with Sepsis)     Musculoskeletal negative musculoskeletal ROS (+)    Abdominal   Peds  Hematology  (+) Blood dyscrasia, anemia   Anesthesia Other Findings   Reproductive/Obstetrics                             Anesthesia Physical Anesthesia Plan  ASA: 2  Anesthesia Plan: General   Post-op Pain Management:    Induction: Intravenous  PONV Risk Score and Plan: 4 or greater and Ondansetron  Airway Management Planned: LMA  Additional Equipment:   Intra-op Plan:   Post-operative Plan:   Informed Consent: I have reviewed the patients History and Physical, chart, labs and discussed the procedure including the risks, benefits and alternatives for the proposed anesthesia with the patient or authorized representative who has indicated his/her understanding and acceptance.     Dental advisory given  Plan Discussed with: CRNA  Anesthesia Plan Comments:         Anesthesia Quick Evaluation

## 2023-09-10 ENCOUNTER — Encounter (HOSPITAL_COMMUNITY): Payer: Self-pay | Admitting: Urology

## 2023-09-10 NOTE — Anesthesia Postprocedure Evaluation (Signed)
 Anesthesia Post Note  Patient: Lorraine Barker  Procedure(s) Performed: CYSTOSCOPY WITH RETROGRADE PYELOGRAM, URETEROSCOPY AND STENT PLACEMENT- stent exchange (Right: Ureter) HOLMIUM LASER APPLICATION (Right) CYSTOSCOPY, WITH CALCULUS REMOVAL USING BASKET (Right: Ureter)  Patient location during evaluation: Phase II Anesthesia Type: General Level of consciousness: awake Pain management: pain level controlled Vital Signs Assessment: post-procedure vital signs reviewed and stable Respiratory status: spontaneous breathing and respiratory function stable Cardiovascular status: blood pressure returned to baseline and stable Postop Assessment: no headache and no apparent nausea or vomiting Anesthetic complications: no Comments: Late entry   No notable events documented.   Last Vitals:  Vitals:   09/09/23 1105 09/09/23 1126  BP:  115/83  Pulse: 82 (!) 57  Resp: 15 16  Temp:  (!) 36.4 C  SpO2: 100% 100%    Last Pain:  Vitals:   09/09/23 1126  TempSrc: Oral  PainSc: 0-No pain                 Windell Norfolk

## 2023-09-20 ENCOUNTER — Encounter: Admitting: Urology

## 2023-09-22 ENCOUNTER — Ambulatory Visit (INDEPENDENT_AMBULATORY_CARE_PROVIDER_SITE_OTHER): Admitting: Urology

## 2023-09-22 VITALS — BP 113/71 | HR 80

## 2023-09-22 DIAGNOSIS — N201 Calculus of ureter: Secondary | ICD-10-CM

## 2023-09-22 DIAGNOSIS — Z09 Encounter for follow-up examination after completed treatment for conditions other than malignant neoplasm: Secondary | ICD-10-CM | POA: Diagnosis not present

## 2023-09-22 DIAGNOSIS — Z87442 Personal history of urinary calculi: Secondary | ICD-10-CM | POA: Diagnosis not present

## 2023-09-22 LAB — URINALYSIS, ROUTINE W REFLEX MICROSCOPIC
Bilirubin, UA: NEGATIVE
Glucose, UA: NEGATIVE
Ketones, UA: NEGATIVE
Leukocytes,UA: NEGATIVE
Nitrite, UA: NEGATIVE
Protein,UA: NEGATIVE
RBC, UA: NEGATIVE
Specific Gravity, UA: 1.03 (ref 1.005–1.030)
Urobilinogen, Ur: 0.2 mg/dL (ref 0.2–1.0)
pH, UA: 6 (ref 5.0–7.5)

## 2023-09-22 NOTE — Progress Notes (Signed)
 09/22/2023 9:57 AM   Lorraine Barker [birth year] [id number]  Referring provider: Rick Duff, PA-C [address] [place],  [place] [address]  Followup nephrolithiasis   HPI: Lorraine Barker is a 32yo here for followup for nephrolithiasis. She has been doing well after right ureteroscopic stone extraction. She removed her stent POD#3. No flank pain currently. NO significant LUTS. She has had multiple stone events in the past.    PMH: Past Medical History:  Diagnosis Date   Depression    History of kidney stones    Ovarian cyst     Surgical History: Past Surgical History:  Procedure Laterality Date   APPENDECTOMY     CYSTOSCOPY W/ URETERAL STENT PLACEMENT Right 08/23/2023   Procedure: RETROGRADE CYSTOSCOPY WITH RIGHT URETERAL STENT PLACEMENT;  Surgeon: Lorraine Gauze, MD;  Location: [facility];  Service: Urology;  Laterality: Right;   CYSTOSCOPY WITH RETROGRADE PYELOGRAM, URETEROSCOPY AND STENT PLACEMENT Right 09/09/2023   Procedure: CYSTOSCOPY WITH RETROGRADE PYELOGRAM, URETEROSCOPY AND STENT PLACEMENT- stent exchange;  Surgeon: Lorraine Gauze, MD;  Location: [facility];  Service: Urology;  Laterality: Right;   CYSTOSCOPY/RETROGRADE/URETEROSCOPY/STONE EXTRACTION WITH BASKET Right 09/09/2023   Procedure: CYSTOSCOPY, WITH CALCULUS REMOVAL USING BASKET;  Surgeon: Lorraine Gauze, MD;  Location: [facility];  Service: Urology;  Laterality: Right;   HOLMIUM LASER APPLICATION Right 09/09/2023   Procedure: HOLMIUM LASER APPLICATION;  Surgeon: Lorraine Gauze, MD;  Location: [facility];  Service: Urology;  Laterality: Right;   WISDOM TOOTH EXTRACTION      Home Medications:  Allergies as of 09/22/2023   No Known Allergies      Medication List        Accurate as of September 22, 2023  9:57 AM. If you have any questions, ask your nurse or doctor.          escitalopram 5 MG tablet Commonly known as: LEXAPRO Take 5 mg by mouth daily.   ketorolac 10 MG tablet Commonly known as:  TORADOL Take 10 mg by mouth every 6 (six) hours as needed for moderate pain (pain score 4-6).   ondansetron 4 MG disintegrating tablet Commonly known as: ZOFRAN-ODT Take 4 mg by mouth every 8 (eight) hours as needed for vomiting or nausea.   tamsulosin 0.4 MG Caps capsule Commonly known as: FLOMAX Take 0.4 mg by mouth daily.   traMADol 50 MG tablet Commonly known as: ULTRAM Take 50 mg by mouth every 6 (six) hours as needed for moderate pain (pain score 4-6).        Allergies: No Known Allergies  Family History: Family History  Problem Relation Age of Onset   Ovarian cysts Mother    Hypertension Mother    Kidney Stones Mother    Kidney Stones Father    Diverticulitis Father     Social History:  reports that she has never smoked. She has never used smokeless tobacco. She reports that she does not currently use alcohol. She reports that she does not use drugs.  ROS: All other review of systems were reviewed and are negative except what is noted above in HPI  Physical Exam: BP 113/71   Pulse 80   LMP 09/07/2023 Comment: negative urine pregnancy 09/09/23  Constitutional:  Alert and oriented, No acute distress. HEENT: [place] AT, moist mucus membranes.  Trachea midline, no masses. Cardiovascular: No clubbing, cyanosis, or edema. Respiratory: Normal respiratory effort, no increased work of breathing. GI: Abdomen is soft, nontender, nondistended, no abdominal masses GU: No CVA tenderness.  Lymph: No cervical or inguinal lymphadenopathy. Skin: No rashes, bruises or suspicious lesions. Neurologic: Grossly intact, no focal deficits, moving all 4 extremities. Psychiatric: Normal mood and affect.  Laboratory Data: Lab Results  Component Value Date   WBC 5.5 09/07/2023   HGB 9.5 (L) 09/07/2023   HCT 32.8 (L) 09/07/2023   MCV 85.4 09/07/2023   PLT 297 09/07/2023    Lab Results  Component Value Date   CREATININE 0.78 09/07/2023    No results found for: "PSA"  No results  found for: "TESTOSTERONE"  Lab Results  Component Value Date   HGBA1C 4.9 02/26/2020    Urinalysis    Component Value Date/Time   COLORURINE YELLOW 08/23/2023 1405   APPEARANCEUR Clear 08/30/2023 1138   LABSPEC 1.017 08/23/2023 1405   PHURINE 6.5 08/23/2023 1405   GLUCOSEU Negative 08/30/2023 1138   HGBUR SMALL (A) 08/23/2023 1405   BILIRUBINUR Negative 08/30/2023 1138   KETONESUR TRACE (A) 08/23/2023 1405   PROTEINUR 2+ (A) 08/30/2023 1138   PROTEINUR 30 (A) 08/23/2023 1405   NITRITE Negative 08/30/2023 1138   NITRITE NEGATIVE 08/23/2023 1405   LEUKOCYTESUR 1+ (A) 08/30/2023 1138   LEUKOCYTESUR LARGE (A) 08/23/2023 1405    Lab Results  Component Value Date   LABMICR See below: 08/30/2023   WBCUA 6-10 (A) 08/30/2023   LABEPIT 0-10 08/30/2023   MUCUS Present (A) 08/30/2023   BACTERIA Moderate (A) 08/30/2023    Pertinent Imaging:  No results found for this or any previous visit.  No results found for this or any previous visit.  No results found for this or any previous visit.  No results found for this or any previous visit.  No results found for this or any previous visit.  No results found for this or any previous visit.  No results found for this or any previous visit.  Results for orders placed during the hospital encounter of 04/19/17  CT Renal Stone Study  Narrative CLINICAL DATA:  Left flank pain radiating to the left lower quadrant beginning 6 hours ago. History of stone disease.  EXAM: CT ABDOMEN AND PELVIS WITHOUT CONTRAST  TECHNIQUE: Multidetector CT imaging of the abdomen and pelvis was performed following the standard protocol without IV contrast.  COMPARISON:  07/25/2015  FINDINGS: Lower chest: Normal  Hepatobiliary: Normal  Pancreas: Normal  Spleen: Normal  Adrenals/Urinary Tract: Adrenal glands are normal. Right kidney is normal. Left kidney shows mild swelling an mild prominence of the collecting system an ureter. There is  a 2 mm stone at the left UVJ. No other stone.  Stomach/Bowel: Normal  Vascular/Lymphatic: Normal  Reproductive: Normal  Other: No free fluid or air.  Musculoskeletal: Normal  IMPRESSION: 2 mm stone at the left UVJ, about pass into the bladder. Mild/ minimal hydronephrosis on the left. No other urinary tract finding.   Electronically Signed By: Lorraine Barker Fusi M.D. On: 04/19/2017 12:43   Assessment & Plan:    1. Right ureteral stone (Primary) Metabolic evaluation Followup 8 weeks with renal US - Urinalysis, Routine w reflex microscopic   No follow-ups on file.  Wilkie Aye, MD  [facility] [place]

## 2023-09-23 LAB — CALCULI, WITH PHOTOGRAPH (CLINICAL LAB)
Ammonium Acid Urate Calculi: 10 %
Carbonate Apatite: 60 %
Mg NH4 PO4 (Struvite): 30 %
Weight Calculi: 25 mg

## 2023-09-26 ENCOUNTER — Encounter: Payer: Self-pay | Admitting: Urology

## 2023-09-26 NOTE — Patient Instructions (Signed)

## 2023-11-03 ENCOUNTER — Ambulatory Visit (HOSPITAL_COMMUNITY)
Admission: RE | Admit: 2023-11-03 | Discharge: 2023-11-03 | Disposition: A | Discharge status: Home / Self Care | Source: Ambulatory Visit | Attending: Urology | Admitting: Urology

## 2023-11-03 DIAGNOSIS — N201 Calculus of ureter: Secondary | ICD-10-CM | POA: Insufficient documentation

## 2023-11-03 DIAGNOSIS — N200 Calculus of kidney: Secondary | ICD-10-CM | POA: Diagnosis not present

## 2023-11-17 ENCOUNTER — Ambulatory Visit (INDEPENDENT_AMBULATORY_CARE_PROVIDER_SITE_OTHER): Admitting: Urology

## 2023-11-17 ENCOUNTER — Encounter: Payer: Self-pay | Admitting: Urology

## 2023-11-17 VITALS — BP 95/63 | HR 74

## 2023-11-17 DIAGNOSIS — N201 Calculus of ureter: Secondary | ICD-10-CM

## 2023-11-17 DIAGNOSIS — N200 Calculus of kidney: Secondary | ICD-10-CM

## 2023-11-17 LAB — URINALYSIS, ROUTINE W REFLEX MICROSCOPIC
Bilirubin, UA: NEGATIVE
Glucose, UA: NEGATIVE
Ketones, UA: NEGATIVE
Leukocytes,UA: NEGATIVE
Nitrite, UA: NEGATIVE
Protein,UA: NEGATIVE
Specific Gravity, UA: 1.015 (ref 1.005–1.030)
Urobilinogen, Ur: 0.2 mg/dL (ref 0.2–1.0)
pH, UA: 7 (ref 5.0–7.5)

## 2023-11-17 LAB — MICROSCOPIC EXAMINATION: Bacteria, UA: NONE SEEN

## 2023-11-17 NOTE — Progress Notes (Signed)
 11/17/2023 9:59 AM   Lorraine Barker [birth year] [id number]  Referring provider: [place] Gins, PA-C [address] [place],  [place] [address]  nephrolithiasis   HPI: Lorraine Barker is a 32yo here for followup for nephrolithiasis. She denies any stone events since last visit. Renal US  11/04/2023 shows no calculi and no hydronephrosis. She denies any flank pain. Her stone composition was calcium phosphate,. Urine pH 7.0    PMH: Past Medical History:  Diagnosis Date   Depression    History of kidney stones    Ovarian cyst     Surgical History: Past Surgical History:  Procedure Laterality Date   APPENDECTOMY     CYSTOSCOPY W/ URETERAL STENT PLACEMENT Right 08/23/2023   Procedure: RETROGRADE CYSTOSCOPY WITH RIGHT URETERAL STENT PLACEMENT;  Surgeon: Marco Severs, MD;  Location: [facility];  Service: Urology;  Laterality: Right;   CYSTOSCOPY WITH RETROGRADE PYELOGRAM, URETEROSCOPY AND STENT PLACEMENT Right 09/09/2023   Procedure: CYSTOSCOPY WITH RETROGRADE PYELOGRAM, URETEROSCOPY AND STENT PLACEMENT- stent exchange;  Surgeon: Marco Severs, MD;  Location: [facility];  Service: Urology;  Laterality: Right;   CYSTOSCOPY/RETROGRADE/URETEROSCOPY/STONE EXTRACTION WITH BASKET Right 09/09/2023   Procedure: CYSTOSCOPY, WITH CALCULUS REMOVAL USING BASKET;  Surgeon: Marco Severs, MD;  Location: [facility];  Service: Urology;  Laterality: Right;   HOLMIUM LASER APPLICATION Right 09/09/2023   Procedure: HOLMIUM LASER APPLICATION;  Surgeon: Marco Severs, MD;  Location: [facility];  Service: Urology;  Laterality: Right;   WISDOM TOOTH EXTRACTION      Home Medications:  Allergies as of 11/17/2023   No Known Allergies      Medication List        Accurate as of Nov 17, 2023  9:59 AM. If you have any questions, ask your nurse or doctor.          escitalopram  5 MG tablet Commonly known as: LEXAPRO  Take 5 mg by mouth daily.   ketorolac  10 MG tablet Commonly known as: TORADOL  Take 10 mg  by mouth every 6 (six) hours as needed for moderate pain (pain score 4-6).   ondansetron  4 MG disintegrating tablet Commonly known as: ZOFRAN -ODT Take 4 mg by mouth every 8 (eight) hours as needed for vomiting or nausea.   tamsulosin  0.4 MG Caps capsule Commonly known as: FLOMAX  Take 0.4 mg by mouth daily.   traMADol 50 MG tablet Commonly known as: ULTRAM Take 50 mg by mouth every 6 (six) hours as needed for moderate pain (pain score 4-6).        Allergies: No Known Allergies  Family History: Family History  Problem Relation Age of Onset   Ovarian cysts Mother    Hypertension Mother    Kidney Stones Mother    Kidney Stones Father    Diverticulitis Father     Social History:  reports that she has never smoked. She has never used smokeless tobacco. She reports that she does not currently use alcohol. She reports that she does not use drugs.  ROS: All other review of systems were reviewed and are negative except what is noted above in HPI  Physical Exam: BP 95/63   Pulse 74   Constitutional:  Alert and oriented, No acute distress. HEENT: [place] AT, moist mucus membranes.  Trachea midline, no masses. Cardiovascular: No clubbing, cyanosis, or edema. Respiratory: Normal respiratory effort, no increased work of breathing. GI: Abdomen is soft, nontender, nondistended, no abdominal masses GU: No CVA tenderness.  Lymph: No cervical or inguinal lymphadenopathy. Skin: No rashes,  bruises or suspicious lesions. Neurologic: Grossly intact, no focal deficits, moving all 4 extremities. Psychiatric: Normal mood and affect.  Laboratory Data: Lab Results  Component Value Date   WBC 5.5 09/07/2023   HGB 9.5 (L) 09/07/2023   HCT 32.8 (L) 09/07/2023   MCV 85.4 09/07/2023   PLT 297 09/07/2023    Lab Results  Component Value Date   CREATININE 0.78 09/07/2023    No results found for: "PSA"  No results found for: "TESTOSTERONE"  Lab Results  Component Value Date   HGBA1C 4.9  02/26/2020    Urinalysis    Component Value Date/Time   COLORURINE YELLOW 08/23/2023 1405   APPEARANCEUR Clear 09/22/2023 0937   LABSPEC 1.017 08/23/2023 1405   PHURINE 6.5 08/23/2023 1405   GLUCOSEU Negative 09/22/2023 0937   HGBUR SMALL (A) 08/23/2023 1405   BILIRUBINUR Negative 09/22/2023 0937   KETONESUR TRACE (A) 08/23/2023 1405   PROTEINUR Negative 09/22/2023 0937   PROTEINUR 30 (A) 08/23/2023 1405   NITRITE Negative 09/22/2023 0937   NITRITE NEGATIVE 08/23/2023 1405   LEUKOCYTESUR Negative 09/22/2023 0937   LEUKOCYTESUR LARGE (A) 08/23/2023 1405    Lab Results  Component Value Date   LABMICR Comment 09/22/2023   WBCUA 6-10 (A) 08/30/2023   LABEPIT 0-10 08/30/2023   MUCUS Present (A) 08/30/2023   BACTERIA Moderate (A) 08/30/2023    Pertinent Imaging: Renal US  11/04/2023: Images reviewed and discussed with the patient  No results found for this or any previous visit.  No results found for this or any previous visit.  No results found for this or any previous visit.  No results found for this or any previous visit.  Results for orders placed during the hospital encounter of 11/03/23  Ultrasound renal complete  Narrative CLINICAL DATA:  Nephrolithiasis.  EXAM: RENAL / URINARY TRACT ULTRASOUND COMPLETE  COMPARISON:  CT abdomen and pelvis August 23, 2023  FINDINGS: Right Kidney:  Renal measurements: 10.8 x 4.4 x 5.8 cm = volume: 144 mL. Echogenicity within normal limits. No mass or hydronephrosis visualized.  Left Kidney:  Renal measurements: 11.8 x 4.7 x 4.4 cm = volume: 127 mL. Echogenicity within normal limits. No mass or hydronephrosis visualized.  Bladder:  Appears normal for degree of bladder distention.  Other:  None.  IMPRESSION: Normal renal ultrasound.   Electronically Signed By: Anna Barnes M.D. On: 11/04/2023 13:40  No results found for this or any previous visit.  No results found for this or any previous  visit.  Results for orders placed during the hospital encounter of 04/19/17  CT Renal Stone Study  Narrative CLINICAL DATA:  Left flank pain radiating to the left lower quadrant beginning 6 hours ago. History of stone disease.  EXAM: CT ABDOMEN AND PELVIS WITHOUT CONTRAST  TECHNIQUE: Multidetector CT imaging of the abdomen and pelvis was performed following the standard protocol without IV contrast.  COMPARISON:  07/25/2015  FINDINGS: Lower chest: Normal  Hepatobiliary: Normal  Pancreas: Normal  Spleen: Normal  Adrenals/Urinary Tract: Adrenal glands are normal. Right kidney is normal. Left kidney shows mild swelling an mild prominence of the collecting system an ureter. There is a 2 mm stone at the left UVJ. No other stone.  Stomach/Bowel: Normal  Vascular/Lymphatic: Normal  Reproductive: Normal  Other: No free fluid or air.  Musculoskeletal: Normal  IMPRESSION: 2 mm stone at the left UVJ, about pass into the bladder. Mild/ minimal hydronephrosis on the left. No other urinary tract finding.   Electronically Signed By: Lavonia Powers  Shogry M.D. On: 04/19/2017 12:43   Assessment & Plan:    1. Nephrolithiasis (Primary) We will proceed with metabolic evaluation for her recurrent nephrolithiasis Followup 6 months with renal US   - Urinalysis, Routine w reflex microscopic   No follow-ups on file.  Johnie Nailer, MD  [facility] [place]

## 2023-11-17 NOTE — Patient Instructions (Signed)

## 2023-11-20 LAB — BASIC METABOLIC PANEL WITH GFR
BUN/Creatinine Ratio: 16 (ref 9–23)
BUN: 13 mg/dL (ref 6–20)
CO2: 20 mmol/L (ref 20–29)
Calcium: 9 mg/dL (ref 8.7–10.2)
Chloride: 105 mmol/L (ref 96–106)
Creatinine, Ser: 0.8 mg/dL (ref 0.57–1.00)
Glucose: 90 mg/dL (ref 70–99)
Potassium: 4.9 mmol/L (ref 3.5–5.2)
Sodium: 140 mmol/L (ref 134–144)
eGFR: 99 mL/min/{1.73_m2} (ref >59)

## 2023-11-20 LAB — PTH, INTACT AND CALCIUM: PTH: 47 pg/mL (ref 15–65)

## 2023-11-20 LAB — URIC ACID: Uric Acid: 4.4 mg/dL (ref 2.6–6.2)

## 2023-11-23 ENCOUNTER — Ambulatory Visit: Payer: Self-pay

## 2023-12-08 DIAGNOSIS — F411 Generalized anxiety disorder: Secondary | ICD-10-CM | POA: Diagnosis not present

## 2024-01-10 DIAGNOSIS — F411 Generalized anxiety disorder: Secondary | ICD-10-CM | POA: Diagnosis not present

## 2024-02-18 ENCOUNTER — Other Ambulatory Visit (HOSPITAL_COMMUNITY): Payer: Self-pay

## 2024-04-14 DIAGNOSIS — F411 Generalized anxiety disorder: Secondary | ICD-10-CM | POA: Diagnosis not present

## 2024-05-08 NOTE — Telephone Encounter (Signed)
 Detailed message left informing patient to schedule renal us  prior to f/u appointment.

## 2024-05-17 ENCOUNTER — Ambulatory Visit: Admitting: Urology

## 2024-09-25 ENCOUNTER — Ambulatory Visit (HOSPITAL_COMMUNITY)

## 2024-10-09 ENCOUNTER — Ambulatory Visit: Admitting: Urology
